# Patient Record
Sex: Female | Born: 2000 | Race: Black or African American | Hispanic: No | Smoking: Former smoker
Health system: Southern US, Community
[De-identification: ages and names within clinical notes are randomized; demographics above are authoritative.]

## PROBLEM LIST (undated history)

## (undated) DIAGNOSIS — E282 Polycystic ovarian syndrome: Secondary | ICD-10-CM

## (undated) DIAGNOSIS — N83202 Unspecified ovarian cyst, left side: Secondary | ICD-10-CM

## (undated) DIAGNOSIS — N96 Recurrent pregnancy loss: Secondary | ICD-10-CM

## (undated) DIAGNOSIS — F32A Depression, unspecified: Secondary | ICD-10-CM

## (undated) DIAGNOSIS — N83201 Unspecified ovarian cyst, right side: Secondary | ICD-10-CM

## (undated) DIAGNOSIS — N390 Urinary tract infection, site not specified: Secondary | ICD-10-CM

## (undated) HISTORY — PX: NO PAST SURGERIES: SHX2092

## (undated) HISTORY — DX: Recurrent pregnancy loss: N96

---
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
## 2021-03-03 ENCOUNTER — Encounter (HOSPITAL_BASED_OUTPATIENT_CLINIC_OR_DEPARTMENT_OTHER): Payer: Self-pay

## 2021-03-03 ENCOUNTER — Other Ambulatory Visit: Payer: Self-pay

## 2021-03-03 ENCOUNTER — Emergency Department (HOSPITAL_BASED_OUTPATIENT_CLINIC_OR_DEPARTMENT_OTHER)
Admission: EM | Admit: 2021-03-03 | Discharge: 2021-03-04 | Disposition: A | Discharge status: Home / Self Care | Payer: Medicaid Other | Attending: Emergency Medicine | Admitting: Emergency Medicine

## 2021-03-03 DIAGNOSIS — Z3A00 Weeks of gestation of pregnancy not specified: Secondary | ICD-10-CM | POA: Insufficient documentation

## 2021-03-03 DIAGNOSIS — O219 Vomiting of pregnancy, unspecified: Secondary | ICD-10-CM | POA: Insufficient documentation

## 2021-03-03 DIAGNOSIS — N3000 Acute cystitis without hematuria: Secondary | ICD-10-CM

## 2021-03-03 DIAGNOSIS — B9620 Unspecified Escherichia coli [E. coli] as the cause of diseases classified elsewhere: Secondary | ICD-10-CM | POA: Diagnosis not present

## 2021-03-03 DIAGNOSIS — O2310 Infections of bladder in pregnancy, unspecified trimester: Secondary | ICD-10-CM | POA: Insufficient documentation

## 2021-03-03 HISTORY — DX: Unspecified ovarian cyst, right side: N83.201

## 2021-03-03 HISTORY — DX: Unspecified ovarian cyst, right side: N83.202

## 2021-03-03 NOTE — ED Triage Notes (Addendum)
Pt is present for nausea x three days. No episodes of emesis. Pt c/o some lower abd discomfort. Denies UA sx and afebrile. Unsure if she may be pregnant and her period is late to start. Hx of ovarian cysts.

## 2021-03-04 LAB — URINALYSIS, ROUTINE W REFLEX MICROSCOPIC
Bilirubin Urine: NEGATIVE
Glucose, UA: NEGATIVE mg/dL
Hgb urine dipstick: NEGATIVE
Ketones, ur: 40 mg/dL — AB
Nitrite: POSITIVE — AB
Protein, ur: 30 mg/dL — AB
Specific Gravity, Urine: 1.038 — ABNORMAL HIGH (ref 1.005–1.030)
pH: 6 (ref 5.0–8.0)

## 2021-03-04 LAB — PREGNANCY, URINE: Preg Test, Ur: POSITIVE — AB

## 2021-03-04 MED ORDER — CEPHALEXIN 500 MG PO CAPS: 500.0000 mg | ORAL_CAPSULE | Freq: Three times a day (TID) | ORAL | 0 refills | Status: DC

## 2021-03-04 MED ORDER — PRENATAL COMPLETE 14-0.4 MG PO TABS: 1.0000 | ORAL_TABLET | Freq: Every day | ORAL | 3 refills | Status: DC

## 2021-03-04 MED ORDER — ONDANSETRON 4 MG PO TBDP
4.0000 mg | ORAL_TABLET | Freq: Once | ORAL | Status: AC
  Administered 2021-03-04: 4 mg via ORAL
  Filled 2021-03-04: qty 1

## 2021-03-04 NOTE — ED Provider Notes (Signed)
[place] EMERGENCY DEPT Provider Note   CSN: [id number] Arrival date & time: 03/03/21  2332     History Chief Complaint  Patient presents with   Nausea    Elizabeth Wheeler is a 20 y.o. female.  HPI     This is a 20 year old female with a history of ovarian cyst who presents with nausea x3 days.  No vomiting.  She states she has a history of ovarian cysts but "they do not seem to be bothering me as much is normal."  She denies any abdominal pain.  She is late on her menstrual period.  She states that she was due to have a period in early October.  She is unsure when her last menstrual period was but it was sometime in September.  She has been previously pregnant once with 1 miscarriage.  No vaginal bleeding.  She has not taken a home pregnancy test.  She has not taken anything for her symptoms.  Does report constipation.  Past Medical History:  Diagnosis Date   Cysts of both ovaries     There are no problems to display for this patient.   History reviewed. No pertinent surgical history.   OB History   No obstetric history on file.     No family history on file.  Social History   Tobacco Use   Smoking status: Never   Smokeless tobacco: Never  Substance Use Topics   Alcohol use: Yes    Comment: occ    Home Medications Prior to Admission medications   Medication Sig Start Date End Date Taking? Authorizing Provider  cephALEXin (KEFLEX) 500 MG capsule Take 1 capsule (500 mg total) by mouth 3 (three) times daily. 03/04/21  Yes Porshia Blizzard, Barbette Hair, MD  Prenatal Vit-Fe Fumarate-FA (PRENATAL COMPLETE) 14-0.4 MG TABS Take 1 tablet by mouth daily. 03/04/21  Yes Emmerson Shuffield, Barbette Hair, MD  meloxicam (MOBIC) 7.5 MG tablet Take 1 tablet (7.5 mg total) by mouth daily. 01/17/17   Cuthriell, Charline Bills, PA-C    Allergies    Amoxicillin  Review of Systems   Review of Systems  Constitutional:  Negative for fever.  Gastrointestinal:  Positive for constipation and  nausea. Negative for abdominal pain and vomiting.  Genitourinary:  Negative for dysuria and vaginal bleeding.  All other systems reviewed and are negative.  Physical Exam Updated Vital Signs BP 122/82 (BP Location: Right Arm)   Pulse 65   Temp 98.2 F (36.8 C) (Oral)   Resp 17   Ht 1.626 m (5\' 4" )   Wt 56.7 kg   LMP 01/17/2021 (Exact Date)   SpO2 100%   BMI 21.46 kg/m   Physical Exam Vitals and nursing note reviewed.  Constitutional:      Appearance: She is well-developed.  HENT:     Head: Normocephalic and atraumatic.     Nose: Nose normal.     Mouth/Throat:     Mouth: Mucous membranes are moist.  Eyes:     Pupils: Pupils are equal, round, and reactive to light.  Cardiovascular:     Rate and Rhythm: Normal rate and regular rhythm.     Heart sounds: Normal heart sounds.  Pulmonary:     Effort: Pulmonary effort is normal. No respiratory distress.  Abdominal:     General: Bowel sounds are normal.     Palpations: Abdomen is soft.     Tenderness: There is no abdominal tenderness. There is no guarding or rebound.  Musculoskeletal:     Cervical back:  Neck supple.  Skin:    General: Skin is warm and dry.  Neurological:     Mental Status: She is alert and oriented to person, place, and time.  Psychiatric:        Mood and Affect: Mood normal.    ED Results / Procedures / Treatments   Labs (all labs ordered are listed, but only abnormal results are displayed) Labs Reviewed  URINALYSIS, ROUTINE W REFLEX MICROSCOPIC - Abnormal; Notable for the following components:      Result Value   APPearance HAZY (*)    Specific Gravity, Urine 1.038 (*)    Ketones, ur 40 (*)    Protein, ur 30 (*)    Nitrite POSITIVE (*)    Leukocytes,Ua MODERATE (*)    Bacteria, UA MANY (*)    All other components within normal limits  PREGNANCY, URINE - Abnormal; Notable for the following components:   Preg Test, Ur POSITIVE (*)    All other components within normal limits  URINE CULTURE     EKG None  Radiology No results found.  Procedures Procedures   Medications Ordered in ED Medications  ondansetron (ZOFRAN-ODT) disintegrating tablet 4 mg (4 mg Oral Given 03/04/21 0014)    ED Course  I have reviewed the triage vital signs and the nursing notes.  Pertinent labs & imaging results that were available during my care of the patient were reviewed by me and considered in my medical decision making (see chart for details).    MDM Rules/Calculators/A&P                           Patient presents with nausea.  She is nontoxic-appearing and vital signs are reassuring.  She has a scant period.  She has no abdominal discomfort and her abdominal exam is benign.  Given her symptoms, would suspect pregnancy.  Other considerations include but not limited to, ovarian pathology, UTI.  Urinalysis sent.  Urinalysis is nitrite positive with some white cells.  Urine culture was sent.  Pregnancy test is also positive.  We will treat urine even the patient is without any urinary symptoms.  Recommend Unisom and B6 for her nausea.  Recommend starting a prenatal vitamin and follow-up at [facility].  Given absence of abdominal pain or vaginal bleeding, doubt ectopic or miscarriage at this time; however, patient was advised of return precautions.  After history, exam, and medical workup I feel the patient has been appropriately medically screened and is safe for discharge home. Pertinent diagnoses were discussed with the patient. Patient was given return precautions.  Final Clinical Impression(s) / ED Diagnoses Final diagnoses:  Nausea and vomiting during pregnancy  Acute cystitis without hematuria    Rx / DC Orders ED Discharge Orders          Ordered    Prenatal Vit-Fe Fumarate-FA (PRENATAL COMPLETE) 14-0.4 MG TABS  Daily        03/04/21 0042    cephALEXin (KEFLEX) 500 MG capsule  3 times daily        03/04/21 0045             Dray Dente, Mayer Masker, MD 03/04/21 [phone]

## 2021-03-04 NOTE — Discharge Instructions (Addendum)
You were seen today for nausea and vomiting.  You had a positive pregnancy test.  You may take vitamin B6 25 mg 3 times a day for nausea.  If that does not provide relief.  Take Unisom 1 tablet at night prior to bedtime.  Follow-up with OB/GYN.  Start a prenatal vitamin.  If you develop abdominal pain or vaginal bleeding, you should be reevaluated immediately.  It does also appear that you may have a urinary tract infection.  Take antibiotics as prescribed.

## 2021-03-05 ENCOUNTER — Emergency Department (HOSPITAL_BASED_OUTPATIENT_CLINIC_OR_DEPARTMENT_OTHER)
Admission: EM | Admit: 2021-03-05 | Discharge: 2021-03-05 | Disposition: A | Discharge status: Home / Self Care | Payer: Medicaid Other | Attending: Student | Admitting: Student

## 2021-03-05 ENCOUNTER — Other Ambulatory Visit: Payer: Self-pay

## 2021-03-05 ENCOUNTER — Encounter (HOSPITAL_BASED_OUTPATIENT_CLINIC_OR_DEPARTMENT_OTHER): Payer: Self-pay | Admitting: *Deleted

## 2021-03-05 DIAGNOSIS — U071 COVID-19: Secondary | ICD-10-CM | POA: Diagnosis not present

## 2021-03-05 DIAGNOSIS — O219 Vomiting of pregnancy, unspecified: Secondary | ICD-10-CM

## 2021-03-05 DIAGNOSIS — O99341 Other mental disorders complicating pregnancy, first trimester: Secondary | ICD-10-CM | POA: Insufficient documentation

## 2021-03-05 DIAGNOSIS — O98511 Other viral diseases complicating pregnancy, first trimester: Secondary | ICD-10-CM | POA: Diagnosis not present

## 2021-03-05 DIAGNOSIS — F419 Anxiety disorder, unspecified: Secondary | ICD-10-CM

## 2021-03-05 LAB — URINALYSIS, ROUTINE W REFLEX MICROSCOPIC
Bilirubin Urine: NEGATIVE
Glucose, UA: NEGATIVE mg/dL
Hgb urine dipstick: NEGATIVE
Ketones, ur: 40 mg/dL — AB
Leukocytes,Ua: NEGATIVE
Nitrite: NEGATIVE
Specific Gravity, Urine: 1.018 (ref 1.005–1.030)
pH: 7 (ref 5.0–8.0)

## 2021-03-05 LAB — COMPREHENSIVE METABOLIC PANEL
ALT: 5 U/L (ref 0–44)
AST: 13 U/L — ABNORMAL LOW (ref 15–41)
Albumin: 4.7 g/dL (ref 3.5–5.0)
Alkaline Phosphatase: 40 U/L (ref 38–126)
Anion gap: 12 (ref 5–15)
BUN: <5 mg/dL — ABNORMAL LOW (ref 6–20)
CO2: 20 mmol/L — ABNORMAL LOW (ref 22–32)
Calcium: 9.7 mg/dL (ref 8.9–10.3)
Chloride: 103 mmol/L (ref 98–111)
Creatinine, Ser: 0.52 mg/dL (ref 0.44–1.00)
GFR, Estimated: >60 mL/min (ref >60)
Glucose, Bld: 85 mg/dL (ref 70–99)
Potassium: 3.5 mmol/L (ref 3.5–5.1)
Sodium: 135 mmol/L (ref 135–145)
Total Bilirubin: 1 mg/dL (ref 0.3–1.2)
Total Protein: 7.8 g/dL (ref 6.5–8.1)

## 2021-03-05 LAB — CBC WITH DIFFERENTIAL/PLATELET
Abs Immature Granulocytes: 0.02 10*3/uL (ref 0.00–0.07)
Basophils Absolute: 0 10*3/uL (ref 0.0–0.1)
Basophils Relative: 0 %
Eosinophils Absolute: 0 10*3/uL (ref 0.0–0.5)
Eosinophils Relative: 0 %
HCT: 35.4 % — ABNORMAL LOW (ref 36.0–46.0)
Hemoglobin: 12.3 g/dL (ref 12.0–15.0)
Immature Granulocytes: 0 %
Lymphocytes Relative: 15 %
Lymphs Abs: 1.4 10*3/uL (ref 0.7–4.0)
MCH: 30.1 pg (ref 26.0–34.0)
MCHC: 34.7 g/dL (ref 30.0–36.0)
MCV: 86.8 fL (ref 80.0–100.0)
Monocytes Absolute: 0.7 10*3/uL (ref 0.1–1.0)
Monocytes Relative: 8 %
Neutro Abs: 6.9 10*3/uL (ref 1.7–7.7)
Neutrophils Relative %: 77 %
Platelets: 354 10*3/uL (ref 150–400)
RBC: 4.08 MIL/uL (ref 3.87–5.11)
RDW: 13 % (ref 11.5–15.5)
WBC: 9.1 10*3/uL (ref 4.0–10.5)
nRBC: 0 % (ref 0.0–0.2)

## 2021-03-05 LAB — RESP PANEL BY RT-PCR (FLU A&B, COVID) ARPGX2
Influenza A by PCR: NEGATIVE
Influenza B by PCR: NEGATIVE
SARS Coronavirus 2 by RT PCR: POSITIVE — AB

## 2021-03-05 LAB — LIPASE, BLOOD: Lipase: 18 U/L (ref 11–51)

## 2021-03-05 LAB — PREGNANCY, URINE: Preg Test, Ur: POSITIVE — AB

## 2021-03-05 MED ORDER — ONDANSETRON 4 MG PO TBDP
4.0000 mg | ORAL_TABLET | Freq: Once | ORAL | Status: AC
  Administered 2021-03-05: 4 mg via ORAL
  Filled 2021-03-05: qty 1

## 2021-03-05 MED ORDER — ONDANSETRON 4 MG PO TBDP: 4.0000 mg | ORAL_TABLET | Freq: Three times a day (TID) | ORAL | 0 refills | Status: DC | PRN

## 2021-03-05 NOTE — ED Notes (Signed)
D/c paperwork reviewed with pt, including prescription and f/u care. Pt verbalized understanding, no questions at time of d/c. Pt ambulatory to ED exit on RA, no assistance needed.

## 2021-03-05 NOTE — ED Provider Notes (Signed)
[place] EMERGENCY DEPT Provider Note   CSN: [id number] Arrival date & time: 03/05/21  1218     History Chief Complaint  Patient presents with   Emesis   Nausea    Elizabeth Wheeler is a 20 y.o. female currently pregnant in the first trimester who presents emergency department for evaluation of nausea and vomiting as well as anxiety and abdominal pain.  Patient seen in the emergency department at [place] 2 days ago and found to have a urinary tract infection.  Patient currently on Keflex but is having hard time keeping the medicine down due to persistent nausea.  She also endorses anxiety and feels that no one is there for her in the setting of her pregnancy.  She denies vaginal bleeding, chest pain, shortness of breath, headache, fever or other systemic symptoms.  Denies suicidal ideation, homicidal ideation, auditory or visual hallucinations.   Emesis Associated symptoms: abdominal pain   Associated symptoms: no arthralgias, no chills, no cough, no fever and no sore throat       Past Medical History:  Diagnosis Date   Cysts of both ovaries     There are no problems to display for this patient.   History reviewed. No pertinent surgical history.   OB History     Gravida  1   Para      Term      Preterm      AB      Living         SAB      IAB      Ectopic      Multiple      Live Births              No family history on file.  Social History   Tobacco Use   Smoking status: Never   Smokeless tobacco: Never  Vaping Use   Vaping Use: Never used  Substance Use Topics   Alcohol use: Yes    Comment: occ    Home Medications Prior to Admission medications   Medication Sig Start Date End Date Taking? Authorizing Provider  cephALEXin (KEFLEX) 500 MG capsule Take 1 capsule (500 mg total) by mouth 3 (three) times daily. 03/04/21  Yes Horton, Barbette Hair, MD  ondansetron (ZOFRAN ODT) 4 MG disintegrating tablet Take 1 tablet (4 mg  total) by mouth every 8 (eight) hours as needed for nausea or vomiting. 03/05/21  Yes Shalisa Mcquade, MD  Prenatal Vit-Fe Fumarate-FA (PRENATAL COMPLETE) 14-0.4 MG TABS Take 1 tablet by mouth daily. 03/04/21  Yes Horton, Barbette Hair, MD  meloxicam (MOBIC) 7.5 MG tablet Take 1 tablet (7.5 mg total) by mouth daily. Patient not taking: Reported on 03/05/2021 01/17/17   Cuthriell, Charline Bills, PA-C    Allergies    Amoxicillin  Review of Systems   Review of Systems  Constitutional:  Negative for chills and fever.  HENT:  Negative for ear pain and sore throat.   Eyes:  Negative for pain and visual disturbance.  Respiratory:  Negative for cough and shortness of breath.   Cardiovascular:  Negative for chest pain and palpitations.  Gastrointestinal:  Positive for abdominal pain, nausea and vomiting.  Genitourinary:  Negative for dysuria and hematuria.  Musculoskeletal:  Negative for arthralgias and back pain.  Skin:  Negative for color change and rash.  Neurological:  Negative for seizures and syncope.  All other systems reviewed and are negative.  Physical Exam Updated Vital Signs BP 128/72   Pulse 75  Temp 98.3 F (36.8 C)   Resp 15   Ht 5\' 4"  (1.626 m)   Wt 56.7 kg   LMP 01/12/2021   SpO2 100%   BMI 21.46 kg/m   Physical Exam Vitals and nursing note reviewed.  Constitutional:      General: She is not in acute distress.    Appearance: She is well-developed.  HENT:     Head: Normocephalic and atraumatic.  Eyes:     Conjunctiva/sclera: Conjunctivae normal.  Cardiovascular:     Rate and Rhythm: Normal rate and regular rhythm.     Heart sounds: No murmur heard. Pulmonary:     Effort: Pulmonary effort is normal. No respiratory distress.     Breath sounds: Normal breath sounds.  Abdominal:     Palpations: Abdomen is soft.     Tenderness: There is no abdominal tenderness.  Musculoskeletal:     Cervical back: Neck supple.  Skin:    General: Skin is warm and dry.   Neurological:     Mental Status: She is alert.    ED Results / Procedures / Treatments   Labs (all labs ordered are listed, but only abnormal results are displayed) Labs Reviewed  RESP PANEL BY RT-PCR (FLU A&B, COVID) ARPGX2 - Abnormal; Notable for the following components:      Result Value   SARS Coronavirus 2 by RT PCR POSITIVE (*)    All other components within normal limits  COMPREHENSIVE METABOLIC PANEL - Abnormal; Notable for the following components:   CO2 20 (*)    BUN <5 (*)    AST 13 (*)    All other components within normal limits  CBC WITH DIFFERENTIAL/PLATELET - Abnormal; Notable for the following components:   HCT 35.4 (*)    All other components within normal limits  URINALYSIS, ROUTINE W REFLEX MICROSCOPIC - Abnormal; Notable for the following components:   Ketones, ur 40 (*)    Protein, ur TRACE (*)    All other components within normal limits  PREGNANCY, URINE - Abnormal; Notable for the following components:   Preg Test, Ur POSITIVE (*)    All other components within normal limits  LIPASE, BLOOD    EKG None  Radiology No results found.  Procedures Procedures   Medications Ordered in ED Medications  ondansetron (ZOFRAN-ODT) disintegrating tablet 4 mg (4 mg Oral Given 03/05/21 1522)    ED Course  I have reviewed the triage vital signs and the nursing notes.  Pertinent labs & imaging results that were available during my care of the patient were reviewed by me and considered in my medical decision making (see chart for details).    MDM Rules/Calculators/A&P                           Patient seen the emergency department for evaluation of nausea and vomiting pregnancy.  Physical exam is unremarkable.  Bedside ultrasound performed that shows an enlarged uterus but no yolk sac.  No free fluid in the pelvis.  Patient states her last menstrual period was approximately 4 weeks ago and in the setting of no vaginal bleeding I have very low concern for  ectopic at this time.  Laboratory evaluation unremarkable and urinalysis does not show continued signs of infection.  Patient is COVID-positive and was given 4 mg p.o. Zofran which led to complete resolution of her symptoms.  She was given resources for the behavioral health urgent care to discuss her anxiety  in the setting of her pregnancy and a prescription was sent for the ODT Zofran.  Patient presentation likely due to a combination of her COVID-19 infection, vomiting in early pregnancy, and her known urinary tract infection.  She was encouraged to continue to complete her course of Keflex and she was discharged. Final Clinical Impression(s) / ED Diagnoses Final diagnoses:  Nausea and vomiting in pregnancy  Anxiety    Rx / DC Orders ED Discharge Orders          Ordered    ondansetron (ZOFRAN ODT) 4 MG disintegrating tablet  Every 8 hours PRN        03/05/21 1615             Persais Ethridge, Debe Coder, MD 03/05/21 1752

## 2021-03-05 NOTE — ED Triage Notes (Signed)
Pt states she has N/V for about 3 days, now just dry heaves, states she has abd pain at night, Also states she is stressed and not eating.

## 2021-03-05 NOTE — ED Notes (Signed)
Pt ambulatory to bathroom, no assistance needed.  

## 2021-03-06 LAB — URINE CULTURE: Culture: >=100000 — AB

## 2021-03-07 ENCOUNTER — Emergency Department (HOSPITAL_BASED_OUTPATIENT_CLINIC_OR_DEPARTMENT_OTHER)
Admission: EM | Admit: 2021-03-07 | Discharge: 2021-03-07 | Disposition: A | Discharge status: Home / Self Care | Payer: Medicaid Other | Attending: Emergency Medicine | Admitting: Emergency Medicine

## 2021-03-07 ENCOUNTER — Encounter (HOSPITAL_BASED_OUTPATIENT_CLINIC_OR_DEPARTMENT_OTHER): Payer: Self-pay | Admitting: *Deleted

## 2021-03-07 ENCOUNTER — Other Ambulatory Visit: Payer: Self-pay

## 2021-03-07 DIAGNOSIS — O98519 Other viral diseases complicating pregnancy, unspecified trimester: Secondary | ICD-10-CM | POA: Insufficient documentation

## 2021-03-07 DIAGNOSIS — Z3490 Encounter for supervision of normal pregnancy, unspecified, unspecified trimester: Secondary | ICD-10-CM

## 2021-03-07 DIAGNOSIS — O09899 Supervision of other high risk pregnancies, unspecified trimester: Secondary | ICD-10-CM | POA: Diagnosis not present

## 2021-03-07 DIAGNOSIS — R109 Unspecified abdominal pain: Secondary | ICD-10-CM | POA: Diagnosis not present

## 2021-03-07 DIAGNOSIS — R112 Nausea with vomiting, unspecified: Secondary | ICD-10-CM

## 2021-03-07 DIAGNOSIS — Z28310 Unvaccinated for covid-19: Secondary | ICD-10-CM | POA: Diagnosis not present

## 2021-03-07 DIAGNOSIS — U071 COVID-19: Secondary | ICD-10-CM | POA: Insufficient documentation

## 2021-03-07 DIAGNOSIS — J029 Acute pharyngitis, unspecified: Secondary | ICD-10-CM

## 2021-03-07 DIAGNOSIS — Z3A00 Weeks of gestation of pregnancy not specified: Secondary | ICD-10-CM | POA: Insufficient documentation

## 2021-03-07 LAB — CBC WITH DIFFERENTIAL/PLATELET
Abs Immature Granulocytes: 0.03 10*3/uL (ref 0.00–0.07)
Basophils Absolute: 0 10*3/uL (ref 0.0–0.1)
Basophils Relative: 0 %
Eosinophils Absolute: 0.1 10*3/uL (ref 0.0–0.5)
Eosinophils Relative: 1 %
HCT: 34.6 % — ABNORMAL LOW (ref 36.0–46.0)
Hemoglobin: 12.2 g/dL (ref 12.0–15.0)
Immature Granulocytes: 0 %
Lymphocytes Relative: 7 %
Lymphs Abs: 0.9 10*3/uL (ref 0.7–4.0)
MCH: 30.3 pg (ref 26.0–34.0)
MCHC: 35.3 g/dL (ref 30.0–36.0)
MCV: 85.9 fL (ref 80.0–100.0)
Monocytes Absolute: 1.1 10*3/uL — ABNORMAL HIGH (ref 0.1–1.0)
Monocytes Relative: 10 %
Neutro Abs: 9.8 10*3/uL — ABNORMAL HIGH (ref 1.7–7.7)
Neutrophils Relative %: 82 %
Platelets: 348 10*3/uL (ref 150–400)
RBC: 4.03 MIL/uL (ref 3.87–5.11)
RDW: 13 % (ref 11.5–15.5)
WBC: 11.9 10*3/uL — ABNORMAL HIGH (ref 4.0–10.5)
nRBC: 0 % (ref 0.0–0.2)

## 2021-03-07 LAB — URINALYSIS, ROUTINE W REFLEX MICROSCOPIC
Bilirubin Urine: NEGATIVE
Glucose, UA: NEGATIVE mg/dL
Hgb urine dipstick: NEGATIVE
Ketones, ur: 40 mg/dL — AB
Leukocytes,Ua: NEGATIVE
Nitrite: NEGATIVE
Specific Gravity, Urine: 1.026 (ref 1.005–1.030)
pH: 6 (ref 5.0–8.0)

## 2021-03-07 LAB — BASIC METABOLIC PANEL
Anion gap: 11 (ref 5–15)
BUN: 6 mg/dL (ref 6–20)
CO2: 21 mmol/L — ABNORMAL LOW (ref 22–32)
Calcium: 9.7 mg/dL (ref 8.9–10.3)
Chloride: 101 mmol/L (ref 98–111)
Creatinine, Ser: 0.54 mg/dL (ref 0.44–1.00)
GFR, Estimated: >60 mL/min (ref >60)
Glucose, Bld: 85 mg/dL (ref 70–99)
Potassium: 4.1 mmol/L (ref 3.5–5.1)
Sodium: 133 mmol/L — ABNORMAL LOW (ref 135–145)

## 2021-03-07 LAB — GROUP A STREP BY PCR: Group A Strep by PCR: NOT DETECTED

## 2021-03-07 MED ORDER — DOXYLAMINE-PYRIDOXINE 10-10 MG PO TBEC: 2.0000 | DELAYED_RELEASE_TABLET | Freq: Every day | ORAL | 0 refills | Status: DC

## 2021-03-07 MED ORDER — SODIUM CHLORIDE 0.9 % IV BOLUS
1000.0000 mL | Freq: Once | INTRAVENOUS | Status: AC
  Administered 2021-03-07: 1000 mL via INTRAVENOUS

## 2021-03-07 NOTE — Discharge Instructions (Addendum)
You are seen in the emergency department for continued symptoms from COVID along with nausea and vomiting.  You had lab work done and were given IV fluids.  Your strep test was negative. we are prescribing you some nausea medication that is safe in pregnancy.  Please contact your OB for follow-up.  You may also take Tylenol in pregnancy.

## 2021-03-07 NOTE — ED Provider Notes (Signed)
[facility] EMERGENCY DEPT Provider Note   CSN: [id number] Arrival date & time: 03/07/21  2015     History Chief Complaint  Patient presents with   Sore Throat    Elizabeth Wheeler is a 20 y.o. female.  She was here 3 days ago and was diagnosed with COVID.  She is also on antibiotics for UTI.  She continues to not feel well and has a worse sore throat.  She does not feel like she can take her meds because she is nauseous.  No vaginal bleeding chest pain shortness of breath fevers.  She is not COVID vaccinated.  She has not seen her OB yet.  Her last period was in early September.  The history is provided by the patient.  Sore Throat This is a new problem. The current episode started yesterday. The problem occurs constantly. The problem has not changed since onset.Associated symptoms include abdominal pain (Intermittent cramps). Pertinent negatives include no chest pain, no headaches and no shortness of breath. Nothing aggravates the symptoms. Nothing relieves the symptoms. She has tried rest for the symptoms. The treatment provided no relief.      Past Medical History:  Diagnosis Date   Cysts of both ovaries     There are no problems to display for this patient.   History reviewed. No pertinent surgical history.   OB History     Gravida  1   Para      Term      Preterm      AB      Living         SAB      IAB      Ectopic      Multiple      Live Births              No family history on file.  Social History   Tobacco Use   Smoking status: Never   Smokeless tobacco: Never  Vaping Use   Vaping Use: Never used  Substance Use Topics   Alcohol use: Yes    Comment: occ    Home Medications Prior to Admission medications   Medication Sig Start Date End Date Taking? Authorizing Provider  cephALEXin (KEFLEX) 500 MG capsule Take 1 capsule (500 mg total) by mouth 3 (three) times daily. 03/04/21   Horton, Mayer Masker, MD  meloxicam  (MOBIC) 7.5 MG tablet Take 1 tablet (7.5 mg total) by mouth daily. Patient not taking: Reported on 03/05/2021 01/17/17   Cuthriell, Delorise Royals, PA-C  ondansetron (ZOFRAN ODT) 4 MG disintegrating tablet Take 1 tablet (4 mg total) by mouth every 8 (eight) hours as needed for nausea or vomiting. 03/05/21   Kommor, Madison, MD  Prenatal Vit-Fe Fumarate-FA (PRENATAL COMPLETE) 14-0.4 MG TABS Take 1 tablet by mouth daily. 03/04/21   Horton, Mayer Masker, MD    Allergies    Amoxicillin  Review of Systems   Review of Systems  Constitutional:  Negative for fever.  HENT:  Positive for sore throat.   Eyes:  Negative for visual disturbance.  Respiratory:  Negative for shortness of breath.   Cardiovascular:  Negative for chest pain.  Gastrointestinal:  Positive for abdominal pain (Intermittent cramps), nausea and vomiting.  Genitourinary:  Negative for dysuria.  Musculoskeletal:  Negative for neck pain.  Skin:  Negative for rash.  Neurological:  Negative for headaches.   Physical Exam Updated Vital Signs BP 124/86 (BP Location: Right Arm)   Pulse 72   Temp  98 F (36.7 C) (Tympanic)   Resp 16   LMP 01/17/2021 (Exact Date)   SpO2 100%   Physical Exam Vitals and nursing note reviewed.  Constitutional:      General: She is not in acute distress.    Appearance: She is well-developed.  HENT:     Head: Normocephalic and atraumatic.     Mouth/Throat:     Mouth: Mucous membranes are moist.     Pharynx: Oropharynx is clear. No oropharyngeal exudate or posterior oropharyngeal erythema.  Eyes:     Conjunctiva/sclera: Conjunctivae normal.  Cardiovascular:     Rate and Rhythm: Normal rate and regular rhythm.     Heart sounds: No murmur heard. Pulmonary:     Effort: Pulmonary effort is normal. No respiratory distress.     Breath sounds: Normal breath sounds.  Abdominal:     Palpations: Abdomen is soft.     Tenderness: There is no abdominal tenderness. There is no guarding or rebound.   Musculoskeletal:     Cervical back: Neck supple.  Skin:    General: Skin is warm and dry.     Capillary Refill: Capillary refill takes less than 2 seconds.  Neurological:     General: No focal deficit present.     Mental Status: She is alert.    ED Results / Procedures / Treatments   Labs (all labs ordered are listed, but only abnormal results are displayed) Labs Reviewed  BASIC METABOLIC PANEL - Abnormal; Notable for the following components:      Result Value   Sodium 133 (*)    CO2 21 (*)    All other components within normal limits  CBC WITH DIFFERENTIAL/PLATELET - Abnormal; Notable for the following components:   WBC 11.9 (*)    HCT 34.6 (*)    Neutro Abs 9.8 (*)    Monocytes Absolute 1.1 (*)    All other components within normal limits  URINALYSIS, ROUTINE W REFLEX MICROSCOPIC - Abnormal; Notable for the following components:   Ketones, ur 40 (*)    Protein, ur TRACE (*)    All other components within normal limits  GROUP A STREP BY PCR    EKG None  Radiology No results found.  Procedures Procedures   Medications Ordered in ED Medications  sodium chloride 0.9 % bolus 1,000 mL (has no administration in time range)    ED Course  I have reviewed the triage vital signs and the nursing notes.  Pertinent labs & imaging results that were available during my care of the patient were reviewed by me and considered in my medical decision making (see chart for details).    MDM Rules/Calculators/A&P                          Elizabeth Wheeler was evaluated in Emergency Department on 03/07/2021 for the symptoms described in the history of present illness. She was evaluated in the context of the global COVID-19 pandemic, which necessitated consideration that the patient might be at risk for infection with the SARS-CoV-2 virus that causes COVID-19. Institutional protocols and algorithms that pertain to the evaluation of patients at risk for COVID-19 are in a state of rapid  change based on information released by regulatory bodies including the CDC and federal and state organizations. These policies and algorithms were followed during the patient's care in the ED.  This patient complains of nausea and sore throat in early pregnancy COVID; this involves an extensive  number of treatment Options and is a complaint that carries with it a high risk of complications and Morbidity. The differential includes COVID, dehydration, hyperemesis, UTI  I ordered, reviewed and interpreted labs, which included CBC with mildly elevated white count stable hemoglobin, chemistries with some low bicarb reflecting dehydration, urinalysis without signs of infection although does have ketones, strep negative I ordered medication IV fluids Additional history obtained from patient and significant other Previous records obtained and reviewed in epic including prior ED visits  After the interventions stated above, I reevaluated the patient and found patient be nontoxic-appearing and hemodynamically stable.  She appears adequately hydrated now.  We will treat with diclegis for morning sickness.  Recommended follow-up with OB.  Return instructions discussed   Final Clinical Impression(s) / ED Diagnoses Final diagnoses:  Sore throat  Nausea and vomiting, unspecified vomiting type  Early stage of pregnancy  COVID-19 virus infection    Rx / DC Orders ED Discharge Orders          Ordered    Doxylamine-Pyridoxine 10-10 MG TBEC  Daily at bedtime        03/07/21 2210             Hayden Rasmussen, MD 03/08/21 [phone]

## 2021-03-07 NOTE — ED Triage Notes (Signed)
Pt was dx with UTI, being pregnant and covid when she was seen last and was sent home with instructions to come back if she is feeling worse.  Pt is here due to sore throat and not feeling better.

## 2021-03-07 NOTE — ED Notes (Signed)
This RN presented the AVS utilizing Teachback Method. Patient verbalizes understanding of Discharge Instructions. Opportunity for Questioning and Answers were provided. Patient Discharged from ED ambulatory to Home with Significant Other.   

## 2021-03-29 ENCOUNTER — Other Ambulatory Visit: Payer: Self-pay

## 2021-03-29 ENCOUNTER — Emergency Department (HOSPITAL_BASED_OUTPATIENT_CLINIC_OR_DEPARTMENT_OTHER)
Admission: EM | Admit: 2021-03-29 | Discharge: 2021-03-29 | Disposition: A | Discharge status: Home / Self Care | Payer: Medicaid Other | Attending: Emergency Medicine | Admitting: Emergency Medicine

## 2021-03-29 ENCOUNTER — Emergency Department (HOSPITAL_BASED_OUTPATIENT_CLINIC_OR_DEPARTMENT_OTHER): Payer: Medicaid Other

## 2021-03-29 ENCOUNTER — Encounter (HOSPITAL_BASED_OUTPATIENT_CLINIC_OR_DEPARTMENT_OTHER): Payer: Self-pay | Admitting: *Deleted

## 2021-03-29 DIAGNOSIS — Z87891 Personal history of nicotine dependence: Secondary | ICD-10-CM | POA: Insufficient documentation

## 2021-03-29 DIAGNOSIS — R1031 Right lower quadrant pain: Secondary | ICD-10-CM | POA: Diagnosis not present

## 2021-03-29 DIAGNOSIS — N9489 Other specified conditions associated with female genital organs and menstrual cycle: Secondary | ICD-10-CM | POA: Insufficient documentation

## 2021-03-29 DIAGNOSIS — J101 Influenza due to other identified influenza virus with other respiratory manifestations: Secondary | ICD-10-CM | POA: Insufficient documentation

## 2021-03-29 DIAGNOSIS — Z20822 Contact with and (suspected) exposure to covid-19: Secondary | ICD-10-CM | POA: Insufficient documentation

## 2021-03-29 DIAGNOSIS — O039 Complete or unspecified spontaneous abortion without complication: Secondary | ICD-10-CM | POA: Diagnosis not present

## 2021-03-29 DIAGNOSIS — N939 Abnormal uterine and vaginal bleeding, unspecified: Secondary | ICD-10-CM | POA: Diagnosis present

## 2021-03-29 DIAGNOSIS — Z79899 Other long term (current) drug therapy: Secondary | ICD-10-CM | POA: Insufficient documentation

## 2021-03-29 DIAGNOSIS — R52 Pain, unspecified: Secondary | ICD-10-CM

## 2021-03-29 LAB — URINALYSIS, ROUTINE W REFLEX MICROSCOPIC
Bilirubin Urine: NEGATIVE
Glucose, UA: NEGATIVE mg/dL
Ketones, ur: >80 mg/dL — AB
Leukocytes,Ua: NEGATIVE
Nitrite: NEGATIVE
Protein, ur: >300 mg/dL — AB
Specific Gravity, Urine: >1.046 — ABNORMAL HIGH (ref 1.005–1.030)
pH: 6 (ref 5.0–8.0)

## 2021-03-29 LAB — RAPID URINE DRUG SCREEN, HOSP PERFORMED
Amphetamines: NOT DETECTED
Barbiturates: NOT DETECTED
Benzodiazepines: NOT DETECTED
Cocaine: NOT DETECTED
Opiates: NOT DETECTED
Tetrahydrocannabinol: POSITIVE — AB

## 2021-03-29 LAB — COMPREHENSIVE METABOLIC PANEL
ALT: 31 U/L (ref 0–44)
AST: 36 U/L (ref 15–41)
Albumin: 4.7 g/dL (ref 3.5–5.0)
Alkaline Phosphatase: 48 U/L (ref 38–126)
Anion gap: 16 — ABNORMAL HIGH (ref 5–15)
BUN: 10 mg/dL (ref 6–20)
CO2: 18 mmol/L — ABNORMAL LOW (ref 22–32)
Calcium: 9.8 mg/dL (ref 8.9–10.3)
Chloride: 101 mmol/L (ref 98–111)
Creatinine, Ser: 0.59 mg/dL (ref 0.44–1.00)
GFR, Estimated: >60 mL/min (ref >60)
Glucose, Bld: 116 mg/dL — ABNORMAL HIGH (ref 70–99)
Potassium: 3.6 mmol/L (ref 3.5–5.1)
Sodium: 135 mmol/L (ref 135–145)
Total Bilirubin: 0.4 mg/dL (ref 0.3–1.2)
Total Protein: 7.8 g/dL (ref 6.5–8.1)

## 2021-03-29 LAB — CBC
HCT: 35.4 % — ABNORMAL LOW (ref 36.0–46.0)
Hemoglobin: 12.5 g/dL (ref 12.0–15.0)
MCH: 31.1 pg (ref 26.0–34.0)
MCHC: 35.3 g/dL (ref 30.0–36.0)
MCV: 88.1 fL (ref 80.0–100.0)
Platelets: 278 10*3/uL (ref 150–400)
RBC: 4.02 MIL/uL (ref 3.87–5.11)
RDW: 14.4 % (ref 11.5–15.5)
WBC: 10.5 10*3/uL (ref 4.0–10.5)
nRBC: 0 % (ref 0.0–0.2)

## 2021-03-29 LAB — LIPASE, BLOOD: Lipase: 16 U/L (ref 11–51)

## 2021-03-29 LAB — RESP PANEL BY RT-PCR (FLU A&B, COVID) ARPGX2
Influenza A by PCR: POSITIVE — AB
Influenza B by PCR: NEGATIVE
SARS Coronavirus 2 by RT PCR: NEGATIVE

## 2021-03-29 LAB — ABO/RH: ABO/RH(D): A POS

## 2021-03-29 LAB — HCG, QUANTITATIVE, PREGNANCY: hCG, Beta Chain, Quant, S: 17504 m[IU]/mL — ABNORMAL HIGH (ref <5)

## 2021-03-29 MED ORDER — LACTATED RINGERS IV BOLUS
1000.0000 mL | Freq: Once | INTRAVENOUS | Status: AC
  Administered 2021-03-29: 1000 mL via INTRAVENOUS

## 2021-03-29 MED ORDER — ONDANSETRON HCL 4 MG/2ML IJ SOLN
4.0000 mg | Freq: Once | INTRAMUSCULAR | Status: AC
  Administered 2021-03-29: 4 mg via INTRAVENOUS
  Filled 2021-03-29: qty 2

## 2021-03-29 MED ORDER — IBUPROFEN 800 MG PO TABS: 800.0000 mg | ORAL_TABLET | Freq: Three times a day (TID) | ORAL | 0 refills | Status: DC

## 2021-03-29 MED ORDER — ONDANSETRON 4 MG PO TBDP: 4.0000 mg | ORAL_TABLET | ORAL | 0 refills | Status: DC | PRN

## 2021-03-29 NOTE — ED Triage Notes (Signed)
Lower abd pain with vomiting today, feels like lower abd is being pushed on, vaginal bleeding for 2 days, approx 2 mths pregnant

## 2021-03-29 NOTE — ED Provider Notes (Signed)
[facility] EMERGENCY DEPT Provider Note   CSN: [id number] Arrival date & time: 03/29/21  1021     History Chief Complaint  Patient presents with   Vaginal Bleeding   Abdominal Pain    Elizabeth Wheeler is a 20 y.o. female.  HPI Patient reports that she is estimated weeks pregnant.  She reports that she was feeling unwell yesterday during the day.  She was achy and fatigued.  She reports she was having some sweats.  She reports at 5 in the morning today, she awakened suddenly with very severe lower abdominal pain.  She reports it feels like a really intense pressure and pushing sensation in her lower abdomen.  Migrating a little bit from side to side.  She reports that the pain is waxing and waning.  Patient reports that she has been having some vaginal bleeding and spotting for couple of days.  She reports she has been having some discomfort with urination.  This is a first pregnancy for her.    Past Medical History:  Diagnosis Date   Cysts of both ovaries     There are no problems to display for this patient.   History reviewed. No pertinent surgical history.   OB History     Gravida  1   Para      Term      Preterm      AB      Living         SAB      IAB      Ectopic      Multiple      Live Births              No family history on file.  Social History   Tobacco Use   Smoking status: Former    Types: Cigarettes    Quit date: 02/01/2021    Years since quitting: 0.1   Smokeless tobacco: Never  Vaping Use   Vaping Use: Never used  Substance Use Topics   Alcohol use: Yes    Comment: occ   Drug use: Never    Home Medications Prior to Admission medications   Medication Sig Start Date End Date Taking? Authorizing Provider  ibuprofen (ADVIL) 800 MG tablet Take 1 tablet (800 mg total) by mouth 3 (three) times daily. 03/29/21  Yes Arby Barrette, MD  ondansetron (ZOFRAN-ODT) 4 MG disintegrating tablet Take 1 tablet (4 mg  total) by mouth every 4 (four) hours as needed for nausea or vomiting. 03/29/21  Yes Valerian Jewel, Lebron Conners, MD  cephALEXin (KEFLEX) 500 MG capsule Take 1 capsule (500 mg total) by mouth 3 (three) times daily. 03/04/21   Horton, Mayer Masker, MD  Doxylamine-Pyridoxine 10-10 MG TBEC Take 2 tablets by mouth at bedtime. 03/07/21   Terrilee Files, MD  meloxicam (MOBIC) 7.5 MG tablet Take 1 tablet (7.5 mg total) by mouth daily. Patient not taking: Reported on 03/05/2021 01/17/17   Cuthriell, Delorise Royals, PA-C  ondansetron (ZOFRAN ODT) 4 MG disintegrating tablet Take 1 tablet (4 mg total) by mouth every 8 (eight) hours as needed for nausea or vomiting. 03/05/21   Kommor, Madison, MD  Prenatal Vit-Fe Fumarate-FA (PRENATAL COMPLETE) 14-0.4 MG TABS Take 1 tablet by mouth daily. 03/04/21   Horton, Mayer Masker, MD    Allergies    Amoxicillin  Review of Systems   Review of Systems 10 systems reviewed and negative except as per HPI Physical Exam Updated Vital Signs BP 103/69 (BP Location: Left Arm)  Pulse 92   Temp 98.6 F (37 C)   Resp 16   Ht 5\' 4"  (1.626 m)   Wt 59 kg   LMP 01/08/2021 (Exact Date)   SpO2 100%   BMI 22.31 kg/m   Physical Exam Constitutional:      Comments: Alert nontoxic.  No respiratory distress.  Intermittently appears to be in significant pain.  HENT:     Mouth/Throat:     Pharynx: Oropharynx is clear.  Eyes:     Extraocular Movements: Extraocular movements intact.  Cardiovascular:     Rate and Rhythm: Normal rate and regular rhythm.  Pulmonary:     Effort: Pulmonary effort is normal.     Breath sounds: Normal breath sounds.  Abdominal:     Comments: Abdomen soft.  Moderate right lower quadrant pain.  No guarding  Musculoskeletal:        General: No swelling or tenderness. Normal range of motion.     Right lower leg: No edema.     Left lower leg: No edema.  Skin:    General: Skin is warm and dry.  Neurological:     General: No focal deficit present.     Mental  Status: She is oriented to person, place, and time.     Coordination: Coordination normal.  Psychiatric:     Comments: Anxious.    ED Results / Procedures / Treatments   Labs (all labs ordered are listed, but only abnormal results are displayed) Labs Reviewed  RESP PANEL BY RT-PCR (FLU A&B, COVID) ARPGX2 - Abnormal; Notable for the following components:      Result Value   Influenza A by PCR POSITIVE (*)    All other components within normal limits  COMPREHENSIVE METABOLIC PANEL - Abnormal; Notable for the following components:   CO2 18 (*)    Glucose, Bld 116 (*)    Anion gap 16 (*)    All other components within normal limits  CBC - Abnormal; Notable for the following components:   HCT 35.4 (*)    All other components within normal limits  URINALYSIS, ROUTINE W REFLEX MICROSCOPIC - Abnormal; Notable for the following components:   Specific Gravity, Urine >1.046 (*)    Hgb urine dipstick LARGE (*)    Ketones, ur >80 (*)    Protein, ur >300 (*)    All other components within normal limits  RAPID URINE DRUG SCREEN, HOSP PERFORMED - Abnormal; Notable for the following components:   Tetrahydrocannabinol POSITIVE (*)    All other components within normal limits  HCG, QUANTITATIVE, PREGNANCY - Abnormal; Notable for the following components:   hCG, Beta Chain, Quant, S 17,504 (*)    All other components within normal limits  LIPASE, BLOOD  ABO/RH    EKG None  Radiology 03/10/2021 OB LESS THAN 14 WEEKS WITH OB TRANSVAGINAL  Result Date: 03/29/2021 CLINICAL DATA:  20 year old pregnant female presents with 2 days of vaginal bleeding and pelvic cramping. Quantitative beta HCG pending. EDC by LMP: 10/15/2021, projecting to an expected gestational age of [redacted] weeks 3 days. EXAM: OBSTETRIC <14 WK 10/17/2021 AND TRANSVAGINAL OB US TECHNIQUE: Both transabdominal and transvaginal ultrasound examinations were performed for complete evaluation of the gestation as well as the maternal uterus, adnexal  regions, and pelvic cul-de-sac. Transvaginal technique was performed to assess early pregnancy. COMPARISON:  None. FINDINGS: Anteverted uterus measures 10.3 x 4.7 x 5.6 cm. No uterine fibroids. Irregular gestational sac is located in the endocervical canal. Embryo identified within the yolk  sac measuring 10.3 mm by crown-rump length, correlating to a gestational age of [redacted] weeks 1 day. No yolk sac identified. Heterogeneous debris within the irregular yolk sac. No embryonic cardiac activity. Right ovary measures 4.2 x 1.9 x 2.5 cm and is normal. Left ovary is seen only on the transabdominal images, measuring 3.4 x 2.0 x 1.7 cm, normal. No abnormal ovarian or adnexal masses. No abnormal free fluid in the pelvis. IMPRESSION: Sonographic findings are compatible with spontaneous abortion in progress, with irregular gestational sac in the endocervical canal with no yolk sac and with embryo measuring 7 weeks 1 day by crown-rump length. No embryonic cardiac activity, compatible with non-viable gestation. Normal ovaries. No adnexal masses. No abnormal free fluid in the pelvis. Electronically Signed   By: Delbert Phenix M.D.   On: 03/29/2021 12:05    Procedures Procedures   Medications Ordered in ED Medications  lactated ringers bolus 1,000 mL (1,000 mLs Intravenous New Bag/Given 03/29/21 1213)  ondansetron (ZOFRAN) injection 4 mg (4 mg Intravenous Given 03/29/21 1213)    ED Course  I have reviewed the triage vital signs and the nursing notes.  Pertinent labs & imaging results that were available during my care of the patient were reviewed by me and considered in my medical decision making (see chart for details).    MDM Rules/Calculators/A&P                           Patient presents as outlined.  She is confirmed positive pregnancy with quant 20,002 days ago.  Patient reports occasional spotting over the past 2 days.  We will need to proceed with ultrasound to rule out ectopic.  Patient also describes some  general constitutional symptoms over the past 24 hours.  We will proceed with evaluation for other source of illness such as UTI, influenza, other viral illness, appendicitis.  Will initiate treatment with fluid resuscitation and nausea control.  Ultrasound consistent with spontaneous AB.  No ectopic pregnancy identified.  Patient is having cramping and some bleeding.  At this time clinically well in appearance.  Patient is counseled on home management of cramping with ibuprofen, rest and fluid hydration.  She is counseled on anticipated passage of clot and products of conception.  Patient is instructed on follow-up with OB and to the maternity unit at [facility] if worsening or changing symptoms.  Patient also test positive for influenza.  This is consistent with several days of malaise as described.  Patient is clinically well in appearance.  No respiratory distress.  Counseled on Zofran for nausea as needed and ibuprofen for control of body ache or fever. Final Clinical Impression(s) / ED Diagnoses Final diagnoses:  Spontaneous abortion in first trimester  Influenza A    Rx / DC Orders ED Discharge Orders          Ordered    ibuprofen (ADVIL) 800 MG tablet  3 times daily        03/29/21 1354    ondansetron (ZOFRAN-ODT) 4 MG disintegrating tablet  Every 4 hours PRN        03/29/21 1354             Arby Barrette, MD 03/29/21 1400

## 2021-03-29 NOTE — Discharge Instructions (Signed)
1.  At this time ultrasound suggest that your pregnancy is in the process of a miscarriage.  You may have another day or 2 bleeding and cramping.  If your symptoms are persisting or worsening, go to the [facility] maternity hospital at [facility].  Call your obstetrician as soon as possible to get a recheck. 2.  You have also tested positive for influenza.  This will increase your body aches and fatigue.  Take ibuprofen as prescribed every 8 hours for body aches and fever.  You may also take the ibuprofen as prescribed for cramping abdominal pain.  You have been prescribed nausea medicine called Zofran.  Take this if needed.  Return to the emergency department if your symptoms are worsening or new concerning symptoms develop.

## 2021-11-15 ENCOUNTER — Other Ambulatory Visit: Payer: Self-pay

## 2021-11-15 ENCOUNTER — Emergency Department (HOSPITAL_BASED_OUTPATIENT_CLINIC_OR_DEPARTMENT_OTHER)
Admission: EM | Admit: 2021-11-15 | Discharge: 2021-11-15 | Disposition: A | Discharge status: Home / Self Care | Payer: Medicaid Other | Attending: Emergency Medicine | Admitting: Emergency Medicine

## 2021-11-15 ENCOUNTER — Encounter (HOSPITAL_BASED_OUTPATIENT_CLINIC_OR_DEPARTMENT_OTHER): Payer: Self-pay | Admitting: Emergency Medicine

## 2021-11-15 DIAGNOSIS — O99280 Endocrine, nutritional and metabolic diseases complicating pregnancy, unspecified trimester: Secondary | ICD-10-CM | POA: Insufficient documentation

## 2021-11-15 DIAGNOSIS — E871 Hypo-osmolality and hyponatremia: Secondary | ICD-10-CM | POA: Insufficient documentation

## 2021-11-15 DIAGNOSIS — O219 Vomiting of pregnancy, unspecified: Secondary | ICD-10-CM | POA: Insufficient documentation

## 2021-11-15 DIAGNOSIS — Z3A00 Weeks of gestation of pregnancy not specified: Secondary | ICD-10-CM | POA: Diagnosis not present

## 2021-11-15 LAB — CBC WITH DIFFERENTIAL/PLATELET
Abs Immature Granulocytes: 0.03 10*3/uL (ref 0.00–0.07)
Basophils Absolute: 0 10*3/uL (ref 0.0–0.1)
Basophils Relative: 0 %
Eosinophils Absolute: 0 10*3/uL (ref 0.0–0.5)
Eosinophils Relative: 0 %
HCT: 36.8 % (ref 36.0–46.0)
Hemoglobin: 13.2 g/dL (ref 12.0–15.0)
Immature Granulocytes: 0 %
Lymphocytes Relative: 20 %
Lymphs Abs: 1.9 10*3/uL (ref 0.7–4.0)
MCH: 31.7 pg (ref 26.0–34.0)
MCHC: 35.9 g/dL (ref 30.0–36.0)
MCV: 88.5 fL (ref 80.0–100.0)
Monocytes Absolute: 0.8 10*3/uL (ref 0.1–1.0)
Monocytes Relative: 8 %
Neutro Abs: 7 10*3/uL (ref 1.7–7.7)
Neutrophils Relative %: 72 %
Platelets: 344 10*3/uL (ref 150–400)
RBC: 4.16 MIL/uL (ref 3.87–5.11)
RDW: 11.6 % (ref 11.5–15.5)
WBC: 9.9 10*3/uL (ref 4.0–10.5)
nRBC: 0 % (ref 0.0–0.2)

## 2021-11-15 LAB — COMPREHENSIVE METABOLIC PANEL
ALT: 5 U/L (ref 0–44)
AST: 16 U/L (ref 15–41)
Albumin: 5 g/dL (ref 3.5–5.0)
Alkaline Phosphatase: 38 U/L (ref 38–126)
Anion gap: 12 (ref 5–15)
BUN: 6 mg/dL (ref 6–20)
CO2: 21 mmol/L — ABNORMAL LOW (ref 22–32)
Calcium: 10.4 mg/dL — ABNORMAL HIGH (ref 8.9–10.3)
Chloride: 99 mmol/L (ref 98–111)
Creatinine, Ser: 0.55 mg/dL (ref 0.44–1.00)
GFR, Estimated: >60 mL/min (ref >60)
Glucose, Bld: 78 mg/dL (ref 70–99)
Potassium: 3.9 mmol/L (ref 3.5–5.1)
Sodium: 132 mmol/L — ABNORMAL LOW (ref 135–145)
Total Bilirubin: 1.7 mg/dL — ABNORMAL HIGH (ref 0.3–1.2)
Total Protein: 8.2 g/dL — ABNORMAL HIGH (ref 6.5–8.1)

## 2021-11-15 LAB — URINALYSIS, ROUTINE W REFLEX MICROSCOPIC
Bilirubin Urine: NEGATIVE
Glucose, UA: NEGATIVE mg/dL
Hgb urine dipstick: NEGATIVE
Ketones, ur: >80 mg/dL — AB
Nitrite: NEGATIVE
Protein, ur: 30 mg/dL — AB
Specific Gravity, Urine: 1.036 — ABNORMAL HIGH (ref 1.005–1.030)
pH: 6 (ref 5.0–8.0)

## 2021-11-15 LAB — PREGNANCY, URINE: Preg Test, Ur: POSITIVE — AB

## 2021-11-15 LAB — HCG, QUANTITATIVE, PREGNANCY: hCG, Beta Chain, Quant, S: 72795 m[IU]/mL — ABNORMAL HIGH (ref <5)

## 2021-11-15 MED ORDER — LACTATED RINGERS IV BOLUS
1000.0000 mL | Freq: Once | INTRAVENOUS | Status: AC
  Administered 2021-11-15: 1000 mL via INTRAVENOUS

## 2021-11-15 MED ORDER — PROMETHAZINE HCL 25 MG PO TABS: 25.0000 mg | ORAL_TABLET | Freq: Four times a day (QID) | ORAL | 0 refills | Status: DC | PRN

## 2021-11-15 MED ORDER — VITAMIN B-6 25 MG PO TABS: 25.0000 mg | ORAL_TABLET | Freq: Four times a day (QID) | ORAL | 1 refills | Status: DC | PRN

## 2021-11-15 MED ORDER — SODIUM CHLORIDE 0.9 % IV SOLN
12.5000 mg | INTRAVENOUS | Status: AC
  Administered 2021-11-15: 12.5 mg via INTRAVENOUS
  Filled 2021-11-15: qty 0.5

## 2021-11-15 MED ORDER — PROMETHAZINE HCL 25 MG/ML IJ SOLN
INTRAMUSCULAR | Status: AC
  Filled 2021-11-15: qty 1

## 2021-11-15 NOTE — Discharge Instructions (Addendum)
Please follow up with our OBGYN. Return to ER for any pain or vaginal bleeding.   Safe Medications in Pregnancy  Acne: Benzoyl Peroxide Salicylic Acid  Backache/Headache: Tylenol: 2 regular strength every 4 hours OR              2 Extra strength every 6 hours  Colds/Coughs/Allergies: Benadryl (alcohol free) 25 mg every 6 hours as needed Breath right strips Claritin Cepacol throat lozenges Chloraseptic throat spray Cold-Eeze- up to three times per day Cough drops, alcohol free Flonase (by prescription only) Guaifenesin Mucinex Robitussin DM (plain only, alcohol free) Saline nasal spray/drops Sudafed (pseudoephedrine) & Actifed ** use only after [redacted] weeks gestation and if you do not have high blood pressure Tylenol Vicks Vaporub Zinc lozenges Zyrtec   Constipation: Colace Ducolax suppositories Fleet enema Glycerin suppositories Metamucil Milk of magnesia Miralax Senokot Smooth move tea  Diarrhea: Kaopectate Imodium A-D  *NO pepto Bismol  Hemorrhoids: Anusol Anusol HC Preparation H Tucks  Indigestion: Tums Maalox Mylanta Zantac  Pepcid  Insomnia: Benadryl (alcohol free) 25mg  every 6 hours as needed Tylenol PM Unisom, no Gelcaps  Leg Cramps: Tums MagGel  Nausea/Vomiting:  Bonine Dramamine Emetrol Ginger extract Sea bands Meclizine  Nausea medication to take during pregnancy:  Unisom (doxylamine succinate 25 mg tablets) Take one tablet daily at bedtime. If symptoms are not adequately controlled, the dose can be increased to a maximum recommended dose of two tablets daily (1/2 tablet in the morning, 1/2 tablet mid-afternoon and one at bedtime). Vitamin B6 100mg  tablets. Take one tablet twice a day (up to 200 mg per day).  Skin Rashes: Aveeno products Benadryl cream or 25mg  every 6 hours as needed Calamine Lotion 1% cortisone cream  Yeast infection: Gyne-lotrimin 7 Monistat 7  Gum/tooth pain: Anbesol  **If taking multiple  medications, please check labels to avoid duplicating the same active ingredients **take medication as directed on the label ** Do not exceed 4000 mg of tylenol in 24 hours **Do not take medications that contain aspirin or ibuprofen

## 2021-11-15 NOTE — ED Triage Notes (Signed)
Nausea x 3 days. Vomiting today unable to keep anything down. New pregnancy unsure how far along  LMP may 9-14. Prescribed reglan, no relief.

## 2021-11-15 NOTE — ED Notes (Signed)
3 days feeling Nauseated. Today vomited 3 times. Nothing to eat/drink all day, everything is vomited up. No complaint of abdominal pain, trouble urinating or any other problem.   Pt stated that she had bloody vaginal discharge about 3 weeks prior. Ultrasound done, no obvious fetus. HCG in the 500.  Few days later HCG over a thousand.  Current as of 3 days prior HCG roughly 40000.

## 2021-11-15 NOTE — ED Provider Notes (Signed)
[facility] EMERGENCY DEPT Provider Note   CSN: [id number] Arrival date & time: 11/15/21  1741     History  Chief Complaint  Patient presents with   Nausea    Lynae Pederson is a 21 y.o. female.  HPI  Patient is a 21 year old female presented to the emergency room today with complaints of nausea over the past 3 to 4 days states that she has had several episodes of nonbloody nonbilious emesis today.  She has no medications for nausea at home other than doxylamine which she was given by an OB/GYN and states that it has not been working.  She states she has had an ultrasound done before and is scheduled for an ultrasound next week however states that she has not had 1 that showed the baby yet.  She denies any pain and denies any vaginal bleeding no other associate symptoms apart from nausea.  She states she is actually feeling less nauseous currently.   No vaginal discharge, no urinary frequency urgency dysuria hematuria.    Home Medications Prior to Admission medications   Medication Sig Start Date End Date Taking? Authorizing Provider  promethazine (PHENERGAN) 25 MG tablet Take 1 tablet (25 mg total) by mouth every 6 (six) hours as needed for nausea or vomiting. 11/15/21  Yes Martha Soltys S, PA  vitamin B-6 (PYRIDOXINE) 25 MG tablet Take 1 tablet (25 mg total) by mouth every 6 (six) hours as needed. 11/15/21  Yes Brittini Brubeck S, PA  cephALEXin (KEFLEX) 500 MG capsule Take 1 capsule (500 mg total) by mouth 3 (three) times daily. 03/04/21   Horton, Mayer Masker, MD  Doxylamine-Pyridoxine 10-10 MG TBEC Take 2 tablets by mouth at bedtime. 03/07/21   Terrilee Files, MD  ibuprofen (ADVIL) 800 MG tablet Take 1 tablet (800 mg total) by mouth 3 (three) times daily. 03/29/21   Arby Barrette, MD  meloxicam (MOBIC) 7.5 MG tablet Take 1 tablet (7.5 mg total) by mouth daily. Patient not taking: Reported on 03/05/2021 01/17/17   Cuthriell, Delorise Royals, PA-C  ondansetron (ZOFRAN  ODT) 4 MG disintegrating tablet Take 1 tablet (4 mg total) by mouth every 8 (eight) hours as needed for nausea or vomiting. 03/05/21   Kommor, Madison, MD  ondansetron (ZOFRAN-ODT) 4 MG disintegrating tablet Take 1 tablet (4 mg total) by mouth every 4 (four) hours as needed for nausea or vomiting. 03/29/21   Arby Barrette, MD  Prenatal Vit-Fe Fumarate-FA (PRENATAL COMPLETE) 14-0.4 MG TABS Take 1 tablet by mouth daily. 03/04/21   Horton, Mayer Masker, MD      Allergies    Amoxicillin    Review of Systems   Review of Systems  Physical Exam Updated Vital Signs BP 114/68 (BP Location: Right Arm)   Pulse 72   Temp 98.2 F (36.8 C) (Oral)   Resp 16   LMP 09/10/2021 (Exact Date)   SpO2 100%  Physical Exam Vitals and nursing note reviewed.  Constitutional:      General: She is not in acute distress.    Comments: Pleasant well-appearing 21 year old.  In no acute distress.  Sitting comfortably in bed.  Able answer questions appropriately follow commands. No increased work of breathing. Speaking in full sentences.   HENT:     Head: Normocephalic and atraumatic.     Nose: Nose normal.  Eyes:     General: No scleral icterus. Cardiovascular:     Rate and Rhythm: Normal rate and regular rhythm.     Pulses: Normal pulses.  Heart sounds: Normal heart sounds.  Pulmonary:     Effort: Pulmonary effort is normal. No respiratory distress.     Breath sounds: No wheezing.  Abdominal:     Palpations: Abdomen is soft.     Tenderness: There is no abdominal tenderness.  Musculoskeletal:     Cervical back: Normal range of motion.     Right lower leg: No edema.     Left lower leg: No edema.  Skin:    General: Skin is warm and dry.     Capillary Refill: Capillary refill takes less than 2 seconds.  Neurological:     Mental Status: She is alert. Mental status is at baseline.  Psychiatric:        Mood and Affect: Mood normal.        Behavior: Behavior normal.    ED Results / Procedures /  Treatments   Labs (all labs ordered are listed, but only abnormal results are displayed) Labs Reviewed  URINALYSIS, ROUTINE W REFLEX MICROSCOPIC - Abnormal; Notable for the following components:      Result Value   APPearance HAZY (*)    Specific Gravity, Urine 1.036 (*)    Ketones, ur >80 (*)    Protein, ur 30 (*)    Leukocytes,Ua TRACE (*)    All other components within normal limits  PREGNANCY, URINE - Abnormal; Notable for the following components:   Preg Test, Ur POSITIVE (*)    All other components within normal limits  COMPREHENSIVE METABOLIC PANEL - Abnormal; Notable for the following components:   Sodium 132 (*)    CO2 21 (*)    Calcium 10.4 (*)    Total Protein 8.2 (*)    Total Bilirubin 1.7 (*)    All other components within normal limits  HCG, QUANTITATIVE, PREGNANCY - Abnormal; Notable for the following components:   hCG, Beta Chain, Quant, S 72,795 (*)    All other components within normal limits  CBC WITH DIFFERENTIAL/PLATELET    EKG None  Radiology No results found.  Procedures Procedures    Medications Ordered in ED Medications  lactated ringers bolus 1,000 mL (0 mLs Intravenous Stopped 11/15/21 2210)  promethazine (PHENERGAN) 12.5 mg in sodium chloride 0.9 % 50 mL IVPB (0 mg Intravenous Stopped 11/15/21 2134)    ED Course/ Medical Decision Making/ A&P Clinical Course as of 11/16/21 1803  Fri Nov 15, 2021  1958 LMP may 9-14th Nauseated last 3 days vomiting today NBNB.  No medications for sx today but has been trying doxalamine.  [WF]  2000 550.7 October 31 1339.9 June 30th 41540.4 July 12th  [WF]  2001 On the 27th will have US done at [facility].  [WF]    Clinical Course User Index [WF] Gailen Shelter, [place]                           Medical Decision Making Amount and/or Complexity of Data Reviewed Labs: ordered.  Risk OTC drugs. Prescription drug management.   This patient presents to the ED for concern of nausea vomiting,  this involves a number of treatment options, and is a complaint that carries with it a moderate risk of complications and morbidity.  The differential diagnosis includes most likely morning sickness.  She is pregnant she is not having any vaginal pain or vaginal bleeding or abdominal pain.   Co morbidities: Discussed in HPI   Brief History:  Patient is a 21 year old female presented  to the emergency room today with complaints of nausea over the past 3 to 4 days states that she has had several episodes of nonbloody nonbilious emesis today.  She has no medications for nausea at home other than doxylamine which she was given by an OB/GYN and states that it has not been working.  She states she has had an ultrasound done before and is scheduled for an ultrasound next week however states that she has not had 1 that showed the baby yet.  She denies any pain and denies any vaginal bleeding no other associate symptoms apart from nausea.  She states she is actually feeling less nauseous currently.   No vaginal discharge, no urinary frequency urgency dysuria hematuria.    EMR reviewed including pt PMHx, past surgical history and past visits to ER.   See HPI for more details   Lab Tests:   I ordered and independently interpreted labs. Labs notable for CMP with no significant abnormalities apart from mild hyponatremia.  Will hydrate here with IV fluids in the form of 1 L of crystalloid.  CBC unremarkable urine pregnancy positive.  Urinalysis with some ketones.  With the presence of ketones and nausea vomiting will have bedside RN place PIV and hydrate and provide antiemetic.   Imaging Studies:  No imaging studies ordered for this patient    Cardiac Monitoring:      Medicines ordered:  I ordered medication including Phenergan, 1 L LR for nausea vomiting dehydration Reevaluation of the patient after these medicines showed that the patient resolved I have reviewed the patients home  medicines and have made adjustments as needed   Critical Interventions:     Consults/Attending Physician   I discussed this case with my attending physician who cosigned this note including patient's presenting symptoms, physical exam, and planned diagnostics and interventions. Attending physician stated agreement with plan or made changes to plan which were implemented.   Reevaluation:  After the interventions noted above I re-evaluated patient and found that they have :resolved   Social Determinants of Health:      Problem List / ED Course:  Nausea vomiting.  Patient is pregnant and seems to experiencing morning sickness.  Doxylamine at home has not improved her symptoms.  Given 1 dose of Phenergan here with complete resolution.  She was hydrated by IV.  She is tolerating p.o. now.  Recommend close follow-up with OB/GYN.  Will discharge home at this time.  Doubt ectopic pregnancy given no symptoms of pain, bleeding, or any other discomfort.   Dispostion:  After consideration of the diagnostic results and the patients response to treatment, I feel that the patent would benefit from close OB/GYN follow-up.  Return precautions.  Will discharge home with Phenergan to use as a last resort but recommend doxylamine and B6 vitamins.    Final Clinical Impression(s) / ED Diagnoses Final diagnoses:  Nausea and vomiting during pregnancy    Rx / DC Orders ED Discharge Orders          Ordered    vitamin B-6 (PYRIDOXINE) 25 MG tablet  Every 6 hours PRN        11/15/21 2145    promethazine (PHENERGAN) 25 MG tablet  Every 6 hours PRN        11/15/21 2145              Gailen Shelter, [place] 11/16/21 1911    Vanetta Mulders, MD 11/22/21 1645

## 2021-11-15 NOTE — ED Notes (Signed)
Pt drank water, no N/V.

## 2021-11-30 IMAGING — US US OB < 14 WEEKS - US OB TV
1 series · 13 of 28 positions shown · non-contrast
Comparison: None.

CLINICAL DATA: 20-year-old pregnant female presents with 2 days of
vaginal bleeding and pelvic cramping. Quantitative beta HCG pending.
EDC by LMP: 10/15/2021, projecting to an expected gestational age of
11 weeks 3 days.

EXAM:
OBSTETRIC <14 WK US AND TRANSVAGINAL OB US
TECHNIQUE: Both transabdominal and transvaginal ultrasound examinations were
performed for complete evaluation of the gestation as well as the
maternal uterus, adnexal regions, and pelvic cul-de-sac.
Transvaginal technique was performed to assess early pregnancy.

[Series 1: us ob less than 14 weeks with ob transvaginal · 13 of 150 slices shown]
[im 6/150]
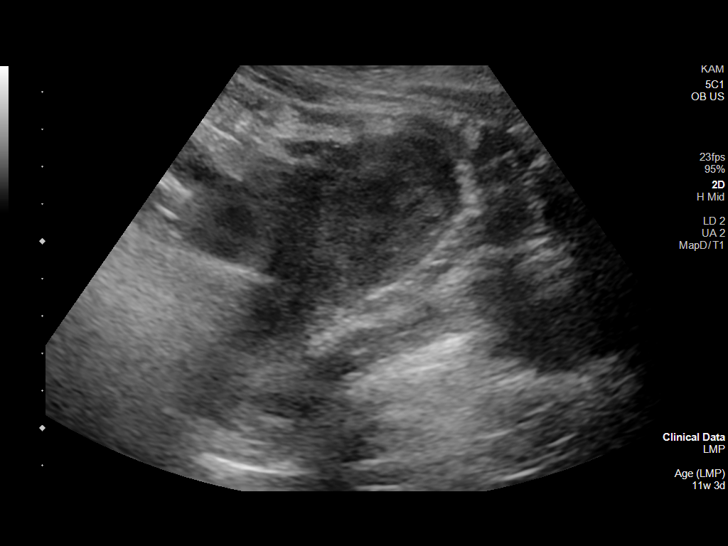
[im 17/150]
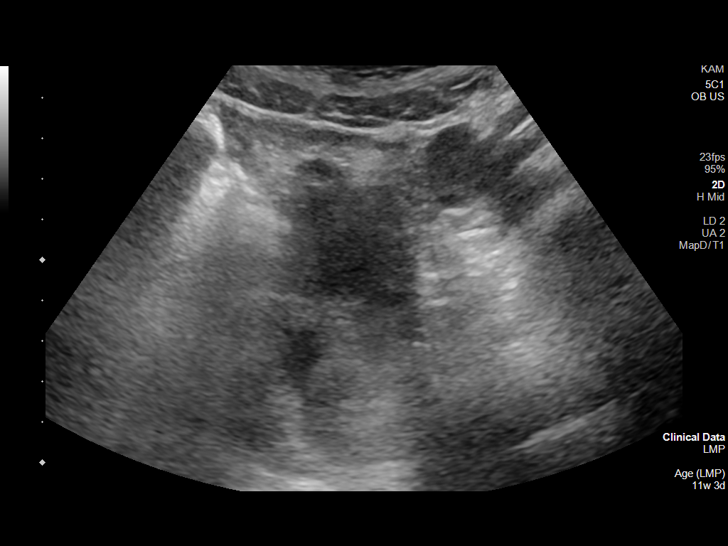
[im 28/150]
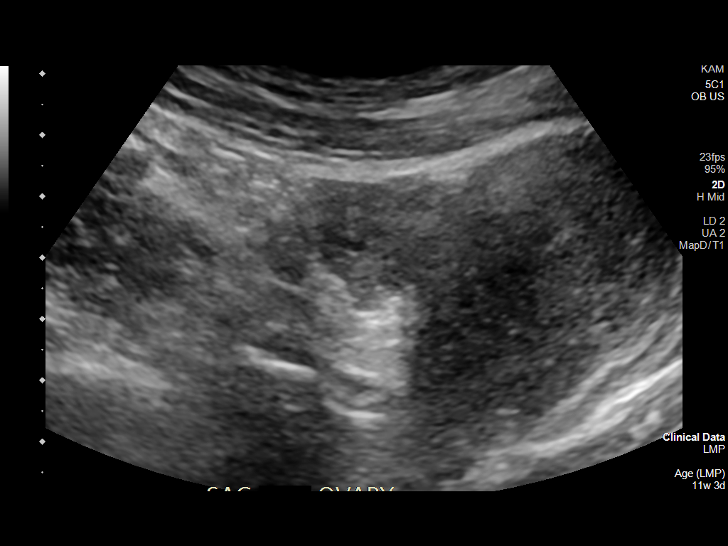
[im 39/150]
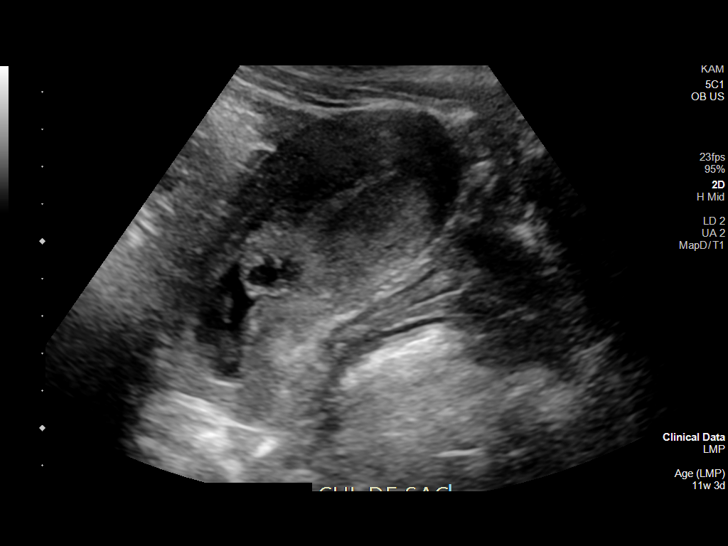
[im 50/150]
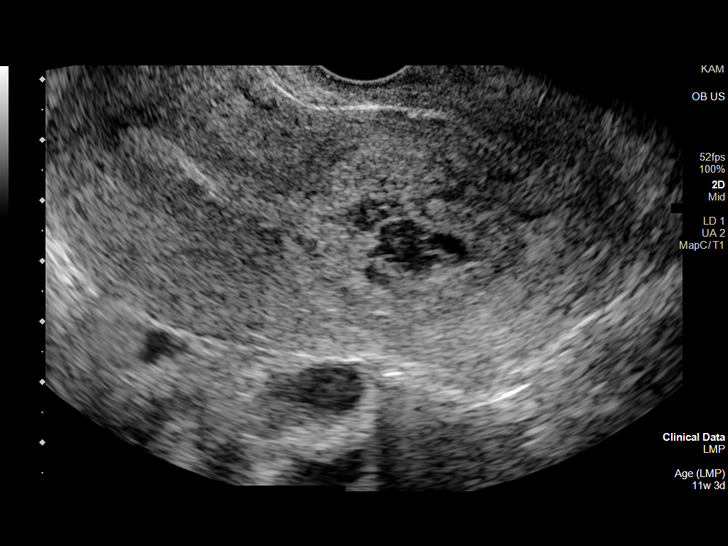
[im 61/150]
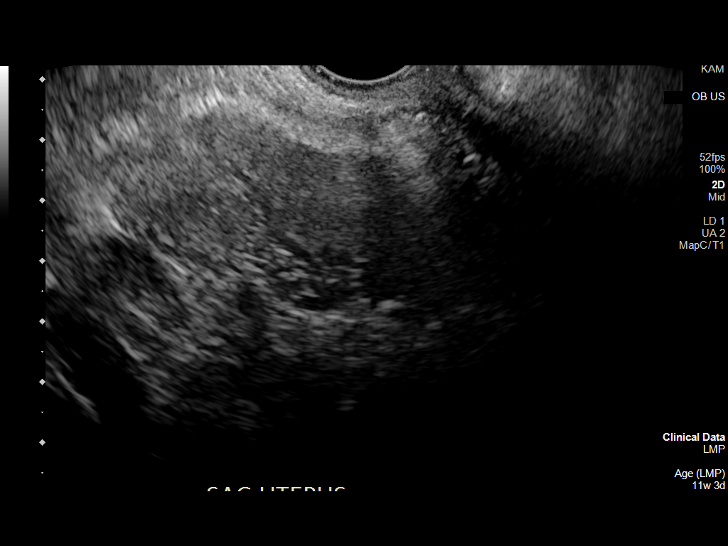
[im 78/150]
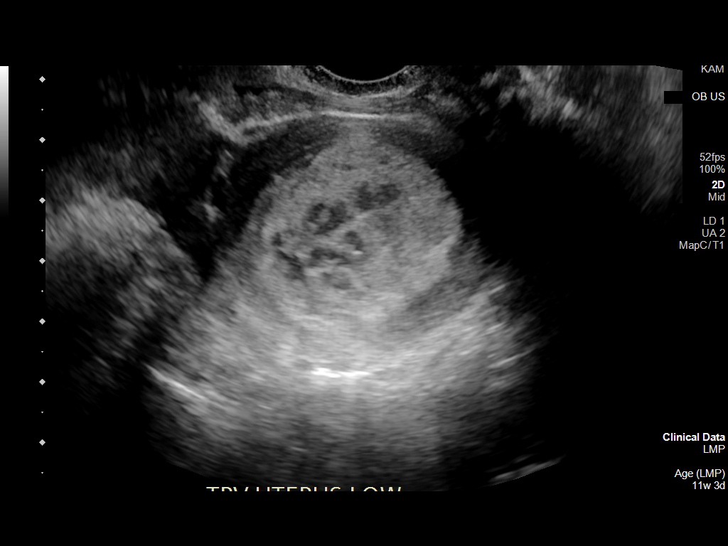
[im 89/150]
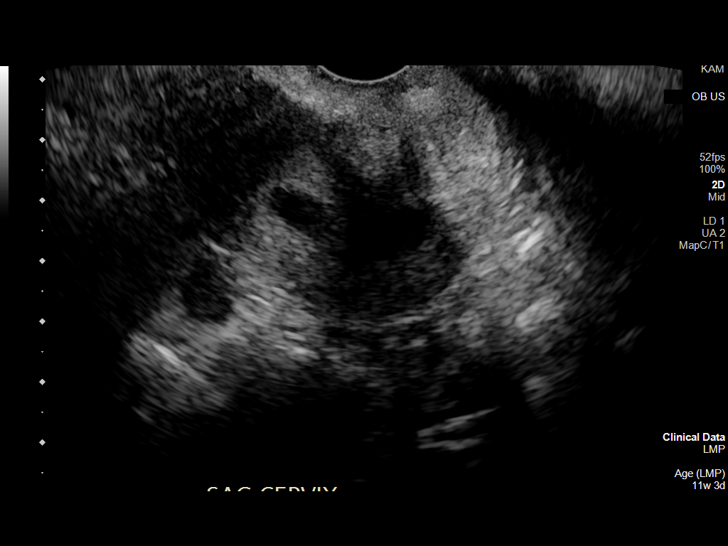
[im 100/150]
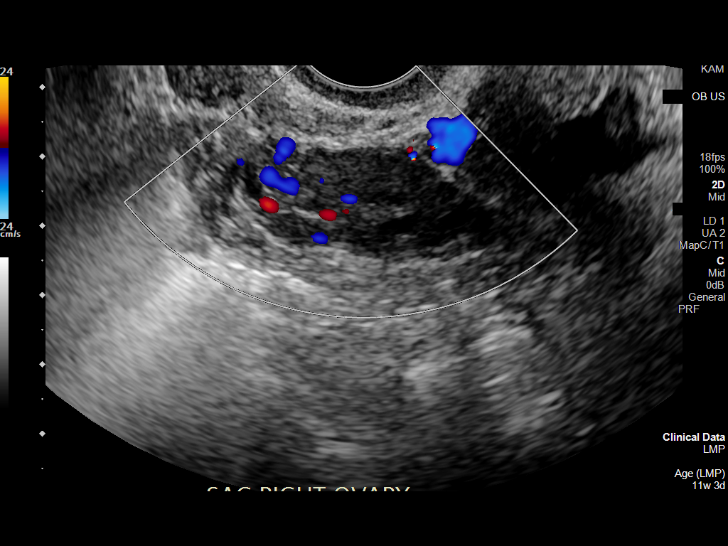
[im 111/150]
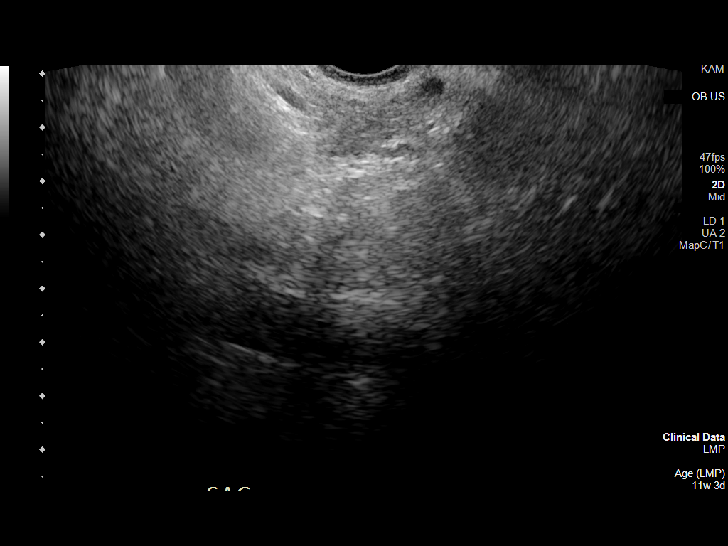
[im 122/150]
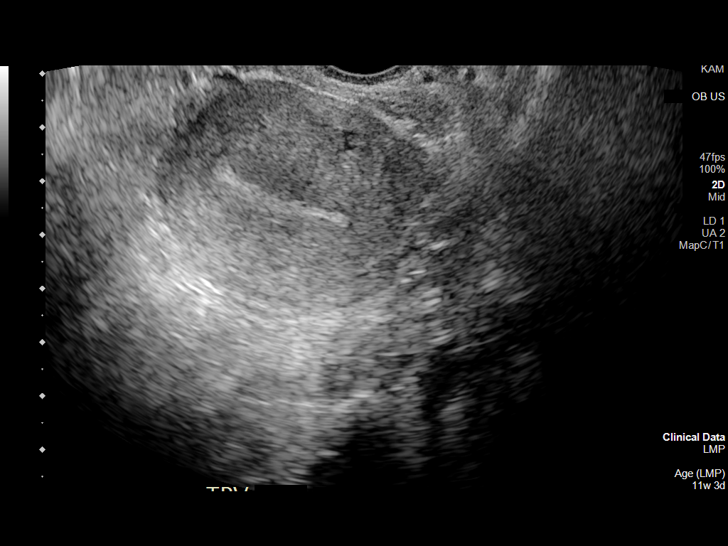
[im 133/150]
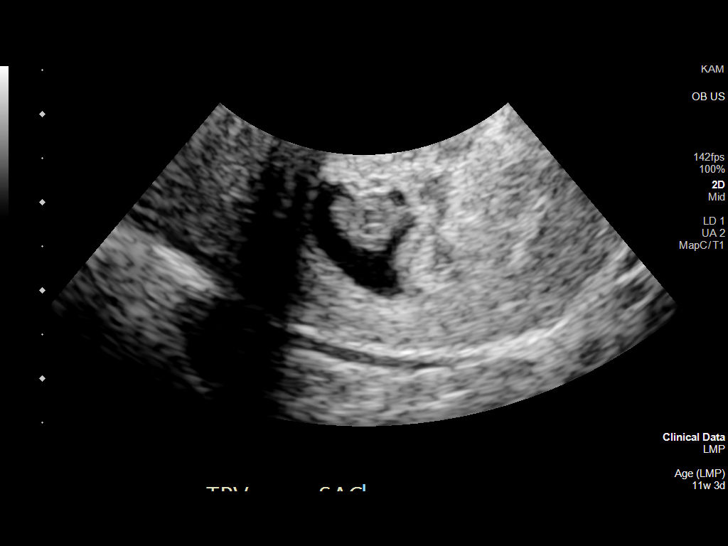
[im 144/150]
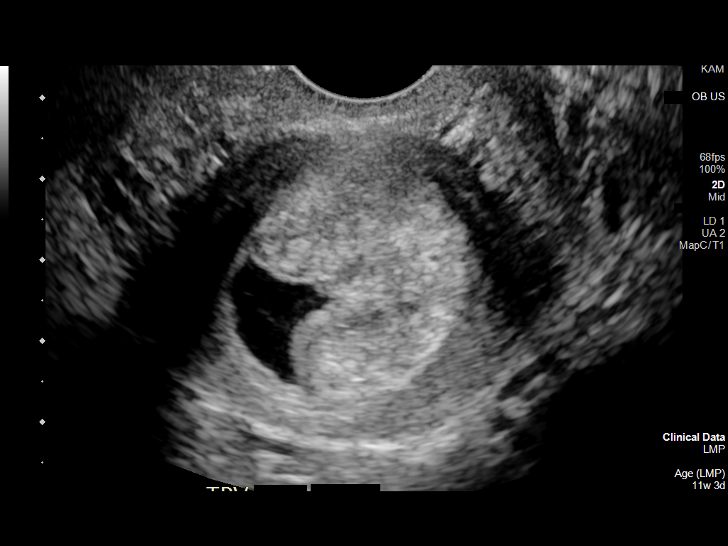

[13 of 28 positions shown; findings below may reference images not displayed]

FINDINGS: Anteverted uterus measures 10.3 x 4.7 x 5.6 cm. No uterine fibroids.

Irregular gestational sac is located in the endocervical canal.
Embryo identified within the yolk sac measuring 10.3 mm by
crown-rump length, correlating to a gestational age of 7 weeks 1
day. No yolk sac identified. Heterogeneous debris within the
irregular yolk sac. No embryonic cardiac activity.

Right ovary measures 4.2 x 1.9 x 2.5 cm and is normal. Left ovary is
seen only on the transabdominal images, measuring 3.4 x 2.0 x
cm, normal. No abnormal ovarian or adnexal masses. No abnormal free
fluid in the pelvis.
IMPRESSION: Sonographic findings are compatible with spontaneous abortion in
progress, with irregular gestational sac in the endocervical canal
with no yolk sac and with embryo measuring 7 weeks 1 day by
crown-rump length. No embryonic cardiac activity, compatible with
non-viable gestation.

Normal ovaries. No adnexal masses. No abnormal free fluid in the
pelvis.

## 2021-12-09 ENCOUNTER — Emergency Department (HOSPITAL_BASED_OUTPATIENT_CLINIC_OR_DEPARTMENT_OTHER): Payer: Medicaid Other

## 2021-12-09 ENCOUNTER — Encounter (HOSPITAL_BASED_OUTPATIENT_CLINIC_OR_DEPARTMENT_OTHER): Payer: Self-pay

## 2021-12-09 ENCOUNTER — Other Ambulatory Visit: Payer: Self-pay

## 2021-12-09 ENCOUNTER — Emergency Department (HOSPITAL_BASED_OUTPATIENT_CLINIC_OR_DEPARTMENT_OTHER)
Admission: EM | Admit: 2021-12-09 | Discharge: 2021-12-09 | Disposition: A | Discharge status: Home / Self Care | Payer: Medicaid Other | Attending: Emergency Medicine | Admitting: Emergency Medicine

## 2021-12-09 DIAGNOSIS — O039 Complete or unspecified spontaneous abortion without complication: Secondary | ICD-10-CM | POA: Diagnosis present

## 2021-12-09 LAB — URINALYSIS, ROUTINE W REFLEX MICROSCOPIC
Bilirubin Urine: NEGATIVE
Glucose, UA: NEGATIVE mg/dL
Ketones, ur: NEGATIVE mg/dL
Leukocytes,Ua: NEGATIVE
Nitrite: NEGATIVE
Protein, ur: NEGATIVE mg/dL
Specific Gravity, Urine: 1.024 (ref 1.005–1.030)
pH: 6 (ref 5.0–8.0)

## 2021-12-09 LAB — HCG, QUANTITATIVE, PREGNANCY: hCG, Beta Chain, Quant, S: 31123 m[IU]/mL — ABNORMAL HIGH (ref <5)

## 2021-12-09 LAB — WET PREP, GENITAL
Clue Cells Wet Prep HPF POC: NONE SEEN
Sperm: NONE SEEN
Trich, Wet Prep: NONE SEEN
WBC, Wet Prep HPF POC: <10 (ref <10)
Yeast Wet Prep HPF POC: NONE SEEN

## 2021-12-09 LAB — PREGNANCY, URINE: Preg Test, Ur: POSITIVE — AB

## 2021-12-09 NOTE — ED Notes (Signed)
Discharge instructions and follow up care reviewed and explained, pt verbalized understanding and had no further questions on d/c. Pt caox4 and ambulatory on d/c.  

## 2021-12-09 NOTE — ED Triage Notes (Signed)
Pt. States vaginal discharge started Saturday. States spotting after urinating color was pink. States happened this am with dark brown discharge and light pink in the pad. States she goes to [place] with no abnormality reports.

## 2021-12-09 NOTE — ED Provider Notes (Signed)
[facility] EMERGENCY DEPT Provider Note   CSN: [id number] Arrival date & time: 12/09/21  1035     History  Chief Complaint  Patient presents with   Vaginal Discharge    Elizabeth Wheeler is a 21 y.o. female G2, P0 A1 who presents emergency department for evaluation of vaginal discharge and bleeding that started Saturday.  Patient is [redacted] weeks pregnant and follows with [facility].  She had an ultrasound done last month along with her first appointment and everything was progressing normally.  She states she noticed some pink vaginal spotting 2 days ago and then this morning she noticed dark brown discharged and then moderate amounts of vaginal bleeding while she was giving a urine sample here in the emergency department.  She states at this point, she needs a pad due to the degree of bleeding.  She denies dysuria, hematuria, vaginal pain, abdominal pain, vomiting, diarrhea and constipation.   Vaginal Discharge Associated symptoms: no abdominal pain, no dysuria, no fever and no vomiting        Home Medications Prior to Admission medications   Medication Sig Start Date End Date Taking? Authorizing Provider  cephALEXin (KEFLEX) 500 MG capsule Take 1 capsule (500 mg total) by mouth 3 (three) times daily. 03/04/21   Horton, Mayer Masker, MD  Doxylamine-Pyridoxine 10-10 MG TBEC Take 2 tablets by mouth at bedtime. 03/07/21   Terrilee Files, MD  ibuprofen (ADVIL) 800 MG tablet Take 1 tablet (800 mg total) by mouth 3 (three) times daily. 03/29/21   Arby Barrette, MD  meloxicam (MOBIC) 7.5 MG tablet Take 1 tablet (7.5 mg total) by mouth daily. Patient not taking: Reported on 03/05/2021 01/17/17   Cuthriell, Delorise Royals, PA-C  ondansetron (ZOFRAN ODT) 4 MG disintegrating tablet Take 1 tablet (4 mg total) by mouth every 8 (eight) hours as needed for nausea or vomiting. 03/05/21   Kommor, Madison, MD  ondansetron (ZOFRAN-ODT) 4 MG disintegrating tablet Take 1 tablet (4 mg  total) by mouth every 4 (four) hours as needed for nausea or vomiting. 03/29/21   Arby Barrette, MD  Prenatal Vit-Fe Fumarate-FA (PRENATAL COMPLETE) 14-0.4 MG TABS Take 1 tablet by mouth daily. 03/04/21   Horton, Mayer Masker, MD  promethazine (PHENERGAN) 25 MG tablet Take 1 tablet (25 mg total) by mouth every 6 (six) hours as needed for nausea or vomiting. 11/15/21   Gailen Shelter, PA  vitamin B-6 (PYRIDOXINE) 25 MG tablet Take 1 tablet (25 mg total) by mouth every 6 (six) hours as needed. 11/15/21   Gailen Shelter, PA      Allergies    Amoxicillin    Review of Systems   Review of Systems  Constitutional:  Negative for fever.  Gastrointestinal:  Negative for abdominal pain, diarrhea and vomiting.  Genitourinary:  Positive for vaginal bleeding and vaginal discharge. Negative for dysuria, hematuria and pelvic pain.    Physical Exam Updated Vital Signs BP 127/79   Pulse 78   Temp 99 F (37.2 C) (Oral)   Resp 20   Ht 5\' 4"  (1.626 m)   Wt 51.7 kg   LMP 09/10/2021 (Exact Date)   SpO2 100%   BMI 19.57 kg/m  Physical Exam Vitals and nursing note reviewed. Exam conducted with a chaperone present.  Constitutional:      General: She is not in acute distress.    Appearance: She is not ill-appearing.  HENT:     Head: Atraumatic.  Eyes:     Conjunctiva/sclera: Conjunctivae normal.  Cardiovascular:     Rate and Rhythm: Normal rate and regular rhythm.     Pulses: Normal pulses.     Heart sounds: No murmur heard. Pulmonary:     Effort: Pulmonary effort is normal. No respiratory distress.     Breath sounds: Normal breath sounds.  Abdominal:     General: Abdomen is flat. There is no distension.     Palpations: Abdomen is soft.     Tenderness: There is no abdominal tenderness.  Genitourinary:    Vagina: No tenderness.     Cervix: Cervical bleeding present. No cervical motion tenderness or discharge.     Comments: Moderate amounts of blood with a  few clots. Possible dilation of  os Musculoskeletal:        General: Normal range of motion.     Cervical back: Normal range of motion.  Skin:    General: Skin is warm and dry.     Capillary Refill: Capillary refill takes less than 2 seconds.  Neurological:     General: No focal deficit present.     Mental Status: She is alert.  Psychiatric:        Mood and Affect: Mood normal.     ED Results / Procedures / Treatments   Labs (all labs ordered are listed, but only abnormal results are displayed) Labs Reviewed  URINALYSIS, ROUTINE W REFLEX MICROSCOPIC - Abnormal; Notable for the following components:      Result Value   Hgb urine dipstick LARGE (*)    All other components within normal limits  PREGNANCY, URINE - Abnormal; Notable for the following components:   Preg Test, Ur POSITIVE (*)    All other components within normal limits  HCG, QUANTITATIVE, PREGNANCY - Abnormal; Notable for the following components:   hCG, Beta Chain, Quant, S 31,123 (*)    All other components within normal limits  WET PREP, GENITAL  GC/CHLAMYDIA PROBE AMP ([place]) NOT AT [facility]    EKG None  Radiology US OB Comp Less 14 Wks  Result Date: 12/09/2021 CLINICAL DATA:  Vaginal bleeding.  Decreasing beta HCG EXAM: OBSTETRIC <14 WK ULTRASOUND TECHNIQUE: Transabdominal ultrasound was performed for evaluation of the gestation as well as the maternal uterus and adnexal regions. COMPARISON:  None Available. FINDINGS: Intrauterine gestational sac: Single Yolk sac:  Not Visualized. Embryo:  Visualized. Cardiac Activity: Not Visualized. Heart Rate: 0 bpm CRL:   18.7 mm   8 w 3 d                  [place] EDC: Subchorionic hemorrhage:  None visualized. Maternal uterus/adnexae: Bilateral ovaries and adnexal regions within normal limits. No free fluid within the pelvis. IMPRESSION: Intrauterine gestational sac containing embryo with no cardiac activity. Findings meet definitive criteria for failed pregnancy. This follows SRU consensus guidelines:  Diagnostic Criteria for Nonviable Pregnancy Early in the First Trimester. Alison Stalling J Med [phone]. Electronically Signed   By: Davina Poke D.O.   On: 12/09/2021 13:37    Procedures Procedures    Medications Ordered in ED Medications - No data to display  ED Course/ Medical Decision Making/ A&P                           Medical Decision Making Amount and/or Complexity of Data Reviewed Labs: ordered. Radiology: ordered.   21 year old pregnant female presents to the emergency department for evaluation of vaginal bleeding that started 2 days ago and has become  increasingly worse.  Differentials include subchorionic hematoma, miscarriage, trauma.  Vitals without significant abnormality.  On exam, patient is well-appearing and in no acute distress.  She has no abdominal tenderness.  On GU exam, there was moderate amounts of blood noted within the vaginal canal and possible dilation of the cervical os.  I ordered OB ultrasound which shows a fetus with no cardiac activity consistent with a failed pregnancy.  hCG quant has gone down to 31,000 and from the mid 70,000s a couple of weeks ago.  Discussed results with patient and advised on typical course of miscarriage.  Advised to follow-up with her OB/GYN either today or tomorrow for evaluation.  Patient expresses understanding is amenable to plan.  Discharged home in good condition.  Final Clinical Impression(s) / ED Diagnoses Final diagnoses:  Miscarriage at 8 to [redacted] weeks gestation    Rx / DC Orders ED Discharge Orders     None         Janell Quiet, [place] 12/09/21 1655    Pricilla Loveless, MD 12/10/21 [phone]

## 2021-12-09 NOTE — Discharge Instructions (Signed)
I am so sorry to tell you that your ultrasound has shown that you are currently having a miscarriage.  I attached some information here for you regarding miscarriage, however would like you to call your OB/GYN to talk to them directly.  You may notice some bleeding ongoing over the next several days.  Again I am so sorry for your loss.

## 2021-12-10 ENCOUNTER — Other Ambulatory Visit: Payer: Self-pay

## 2021-12-10 ENCOUNTER — Encounter (HOSPITAL_BASED_OUTPATIENT_CLINIC_OR_DEPARTMENT_OTHER): Payer: Self-pay

## 2021-12-10 DIAGNOSIS — Z3A10 10 weeks gestation of pregnancy: Secondary | ICD-10-CM | POA: Diagnosis not present

## 2021-12-10 DIAGNOSIS — Z5321 Procedure and treatment not carried out due to patient leaving prior to being seen by health care provider: Secondary | ICD-10-CM | POA: Diagnosis not present

## 2021-12-10 DIAGNOSIS — O209 Hemorrhage in early pregnancy, unspecified: Secondary | ICD-10-CM | POA: Insufficient documentation

## 2021-12-10 LAB — GC/CHLAMYDIA PROBE AMP (~~LOC~~) NOT AT ARMC
Chlamydia: NEGATIVE
Comment: NEGATIVE
Comment: NORMAL
Neisseria Gonorrhea: NEGATIVE

## 2021-12-10 NOTE — ED Notes (Signed)
Pt placed in WC, pt is hyperventilating, instructed to take slow deep breaths and emesis bag given.  Significant other is present

## 2021-12-10 NOTE — ED Triage Notes (Signed)
Pt states she is approx [redacted] weeks pregnant and is bleeding.  Pain is worse than bleeding. 10/10 N/V all evening

## 2021-12-11 ENCOUNTER — Emergency Department (HOSPITAL_BASED_OUTPATIENT_CLINIC_OR_DEPARTMENT_OTHER)
Admission: EM | Admit: 2021-12-11 | Discharge: 2021-12-11 | Discharge status: Against Medical Advice | Payer: Medicaid Other | Attending: Emergency Medicine | Admitting: Emergency Medicine

## 2022-04-24 ENCOUNTER — Ambulatory Visit: Payer: Self-pay

## 2022-04-24 NOTE — Telephone Encounter (Signed)
    Chief Complaint: White vaginal discharge, mild itching Symptoms: Above Frequency: Yesterday Pertinent Negatives: Patient denies any rash or sores Disposition: [] ED /[] Urgent Care (no appt availability in office) / [] Appointment(In office/virtual)/ []  [facility]/ [x] Home Care/ [] Refused Recommended Disposition /[] [facility]/ []  Follow-up with PCP Additional Notes: If no better in 5-7 days, see UC.  Reason for Disposition  [1] Symptoms of a yeast infection (i.e., itchy, white discharge, not bad smelling) AND [2] feels like prior vaginal yeast infections  Answer Assessment - Initial Assessment Questions 1. SYMPTOM: "What's the main symptom you're concerned about?" (e.g., pain, itching, dryness)     Inside itching 2. LOCATION: "Where is the   located?" (e.g., inside/outside, left/right)     inside 3. ONSET: "When did the    start?"     This week 4. PAIN: "Is there any pain?" If Yes, ask: "How bad is it?" (Scale: 1-10; mild, moderate, severe)   -  MILD (1-3): Doesn't interfere with normal activities.    -  MODERATE (4-7): Interferes with normal activities (e.g., work or school) or awakens from sleep.     -  SEVERE (8-10): Excruciating pain, unable to do any normal activities.     Discomfort 5. ITCHING: "Is there any itching?" If Yes, ask: "How bad is it?" (Scale: 1-10; mild, moderate, severe)     Yes 6. CAUSE: "What do you think is causing the discharge?" "Have you had the same problem before? What happened then?"     Yeast 7. OTHER SYMPTOMS: "Do you have any other symptoms?" (e.g., fever, itching, vaginal bleeding, pain with urination, injury to genital area, vaginal foreign body)     No 8. PREGNANCY: "Is there any chance you are pregnant?" "When was your last menstrual period?"     No  Protocols used: Vaginal Symptoms-A-AH

## 2022-04-24 NOTE — Telephone Encounter (Signed)
Summary: Pt would like to discuss otc med for yeast infection   Pt stated she feels she may have a yeast infection and she would like to discuss possible otc medications that she can try. Pt requests call back to discuss. Cb# [phone]     Left message to call back about symptoms.

## 2022-05-15 ENCOUNTER — Other Ambulatory Visit: Payer: Self-pay

## 2022-05-15 ENCOUNTER — Inpatient Hospital Stay (HOSPITAL_COMMUNITY): Payer: Medicaid Other

## 2022-05-15 ENCOUNTER — Inpatient Hospital Stay (HOSPITAL_COMMUNITY)
Admission: AD | Admit: 2022-05-15 | Discharge: 2022-05-15 | Disposition: A | Discharge status: Home / Self Care | Payer: Medicaid Other | Attending: Obstetrics and Gynecology | Admitting: Obstetrics and Gynecology

## 2022-05-15 ENCOUNTER — Encounter (HOSPITAL_COMMUNITY): Payer: Self-pay | Admitting: Obstetrics and Gynecology

## 2022-05-15 DIAGNOSIS — O26891 Other specified pregnancy related conditions, first trimester: Secondary | ICD-10-CM | POA: Diagnosis not present

## 2022-05-15 DIAGNOSIS — O26899 Other specified pregnancy related conditions, unspecified trimester: Secondary | ICD-10-CM

## 2022-05-15 DIAGNOSIS — R109 Unspecified abdominal pain: Secondary | ICD-10-CM | POA: Diagnosis not present

## 2022-05-15 DIAGNOSIS — Z3A01 Less than 8 weeks gestation of pregnancy: Secondary | ICD-10-CM

## 2022-05-15 DIAGNOSIS — O3680X0 Pregnancy with inconclusive fetal viability, not applicable or unspecified: Secondary | ICD-10-CM | POA: Diagnosis present

## 2022-05-15 LAB — CBC
HCT: 36.1 % (ref 36.0–46.0)
Hemoglobin: 12.8 g/dL (ref 12.0–15.0)
MCH: 30.8 pg (ref 26.0–34.0)
MCHC: 35.5 g/dL (ref 30.0–36.0)
MCV: 86.8 fL (ref 80.0–100.0)
Platelets: 394 K/uL (ref 150–400)
RBC: 4.16 MIL/uL (ref 3.87–5.11)
RDW: 12.1 % (ref 11.5–15.5)
WBC: 8.5 K/uL (ref 4.0–10.5)
nRBC: 0 % (ref 0.0–0.2)

## 2022-05-15 LAB — URINALYSIS, ROUTINE W REFLEX MICROSCOPIC
Bilirubin Urine: NEGATIVE
Glucose, UA: NEGATIVE mg/dL
Hgb urine dipstick: NEGATIVE
Ketones, ur: NEGATIVE mg/dL
Leukocytes,Ua: NEGATIVE
Nitrite: NEGATIVE
Protein, ur: NEGATIVE mg/dL
Specific Gravity, Urine: 1.008 (ref 1.005–1.030)
pH: 7 (ref 5.0–8.0)

## 2022-05-15 LAB — POCT PREGNANCY, URINE: Preg Test, Ur: POSITIVE — AB

## 2022-05-15 LAB — WET PREP, GENITAL
Clue Cells Wet Prep HPF POC: NONE SEEN
Sperm: NONE SEEN
Trich, Wet Prep: NONE SEEN
WBC, Wet Prep HPF POC: <10 (ref <10)
Yeast Wet Prep HPF POC: NONE SEEN

## 2022-05-15 LAB — HCG, QUANTITATIVE, PREGNANCY: hCG, Beta Chain, Quant, S: 669 m[IU]/mL — ABNORMAL HIGH (ref <5)

## 2022-05-15 NOTE — MAU Note (Signed)
Elizabeth Wheeler is a 22 y.o. at [redacted]w[redacted]d here in MAU reporting: started cramping today and has been going on longer then 30 min and is concerned due to hx of miscarriage. No bleeding. Was Rx metrogel for BV but stopped after 5 days.   LMP: 04/10/22  Onset of complaint: today  Pain score: 4/10  Vitals:   05/15/22 1405  BP: 123/85  Pulse: 96  Resp: 16  Temp: 98.7 F (37.1 C)  SpO2: 99%     FHT:NA  Lab orders placed from triage: ua, upt

## 2022-05-16 ENCOUNTER — Encounter (HOSPITAL_COMMUNITY): Payer: Self-pay | Admitting: Obstetrics and Gynecology

## 2022-05-16 ENCOUNTER — Inpatient Hospital Stay (HOSPITAL_COMMUNITY): Payer: Medicaid Other

## 2022-05-16 ENCOUNTER — Inpatient Hospital Stay (HOSPITAL_COMMUNITY)
Admission: AD | Admit: 2022-05-16 | Discharge: 2022-05-16 | Disposition: A | Discharge status: Home / Self Care | Payer: Medicaid Other | Attending: Obstetrics and Gynecology | Admitting: Obstetrics and Gynecology

## 2022-05-16 DIAGNOSIS — Z3A01 Less than 8 weeks gestation of pregnancy: Secondary | ICD-10-CM | POA: Diagnosis not present

## 2022-05-16 DIAGNOSIS — Z87891 Personal history of nicotine dependence: Secondary | ICD-10-CM | POA: Diagnosis not present

## 2022-05-16 DIAGNOSIS — R102 Pelvic and perineal pain: Secondary | ICD-10-CM | POA: Insufficient documentation

## 2022-05-16 DIAGNOSIS — O26891 Other specified pregnancy related conditions, first trimester: Secondary | ICD-10-CM | POA: Diagnosis present

## 2022-05-16 DIAGNOSIS — Z3490 Encounter for supervision of normal pregnancy, unspecified, unspecified trimester: Secondary | ICD-10-CM

## 2022-05-16 DIAGNOSIS — R109 Unspecified abdominal pain: Secondary | ICD-10-CM | POA: Diagnosis not present

## 2022-05-16 DIAGNOSIS — M545 Low back pain, unspecified: Secondary | ICD-10-CM | POA: Diagnosis not present

## 2022-05-16 HISTORY — DX: Polycystic ovarian syndrome: E28.2

## 2022-05-16 LAB — GC/CHLAMYDIA PROBE AMP (~~LOC~~) NOT AT ARMC
Chlamydia: NEGATIVE
Comment: NEGATIVE
Comment: NORMAL
Neisseria Gonorrhea: NEGATIVE

## 2022-05-16 LAB — URINALYSIS, ROUTINE W REFLEX MICROSCOPIC
Bilirubin Urine: NEGATIVE
Glucose, UA: NEGATIVE mg/dL
Hgb urine dipstick: NEGATIVE
Ketones, ur: NEGATIVE mg/dL
Leukocytes,Ua: NEGATIVE
Nitrite: NEGATIVE
Protein, ur: NEGATIVE mg/dL
Specific Gravity, Urine: 1.019 (ref 1.005–1.030)
pH: 6 (ref 5.0–8.0)

## 2022-05-16 LAB — HCG, QUANTITATIVE, PREGNANCY: hCG, Beta Chain, Quant, S: 1042 m[IU]/mL — ABNORMAL HIGH (ref <5)

## 2022-05-16 NOTE — Discharge Instructions (Signed)

## 2022-05-16 NOTE — MAU Provider Note (Signed)
  History     CSN: [id number]  Arrival date and time: 05/16/22 1703   Event Date/Time   First Provider Initiated Contact with Patient 05/16/22 2112      Chief Complaint  Patient presents with   Abdominal Pain   HPI Elizabeth Wheeler is a 22 y.o. G3P0020 at [redacted]w[redacted]d who presents to MAU with chief complaint of worsening abdominal pain and new onset low back pain.  Patient receives care with [facility] and is scheduled to be seen in the office on Friday 05/23/2022 {GYN/OB [id number]}  Past Medical History:  Diagnosis Date   Cysts of both ovaries    PCOS (polycystic ovarian syndrome)     History reviewed. No pertinent surgical history.  History reviewed. No pertinent family history.  Social History   Tobacco Use   Smoking status: Former    Types: Cigarettes    Quit date: 02/01/2021    Years since quitting: 1.2   Smokeless tobacco: Never  Vaping Use   Vaping Use: Never used  Substance Use Topics   Alcohol use: Not Currently    Comment: occ   Drug use: Never    Allergies:  Allergies  Allergen Reactions   Amoxicillin Other (See Comments)    Yeast infection    Medications Prior to Admission  Medication Sig Dispense Refill Last Dose   Prenatal Vit-Fe Fumarate-FA (PRENATAL COMPLETE) 14-0.4 MG TABS Take 1 tablet by mouth daily. 60 tablet 3 05/16/2022   vitamin B-6 (PYRIDOXINE) 25 MG tablet Take 1 tablet (25 mg total) by mouth every 6 (six) hours as needed. 14 tablet 1 05/16/2022   cephALEXin (KEFLEX) 500 MG capsule Take 1 capsule (500 mg total) by mouth 3 (three) times daily. 21 capsule 0    Doxylamine-Pyridoxine 10-10 MG TBEC Take 2 tablets by mouth at bedtime. 60 tablet 0    ibuprofen (ADVIL) 800 MG tablet Take 1 tablet (800 mg total) by mouth 3 (three) times daily. 21 tablet 0    meloxicam (MOBIC) 7.5 MG tablet Take 1 tablet (7.5 mg total) by mouth daily. (Patient not taking: Reported on 03/05/2021) 30 tablet 0    ondansetron (ZOFRAN ODT) 4 MG disintegrating tablet Take 1 tablet  (4 mg total) by mouth every 8 (eight) hours as needed for nausea or vomiting. 20 tablet 0    ondansetron (ZOFRAN-ODT) 4 MG disintegrating tablet Take 1 tablet (4 mg total) by mouth every 4 (four) hours as needed for nausea or vomiting. 20 tablet 0    promethazine (PHENERGAN) 25 MG tablet Take 1 tablet (25 mg total) by mouth every 6 (six) hours as needed for nausea or vomiting. 30 tablet 0     Review of Systems Physical Exam   Blood pressure 112/71, pulse 73, temperature 98.7 F (37.1 C), temperature source Oral, resp. rate 17, height 5\' 4"  (1.626 m), weight 49.4 kg, last menstrual period 04/10/2022, SpO2 99 %, unknown if currently breastfeeding.  Physical Exam  MAU Course  Procedures  MDM ***  Assessment and Plan  ***  Darlina Rumpf 05/16/2022, 9:17 PM

## 2022-05-16 NOTE — MAU Note (Signed)
.  Elizabeth Wheeler is a 22 y.o. at [redacted]w[redacted]d here in MAU reporting: Lower abdominal cramping (5/10) that started about an hour ago. She reports being seen in MAU yesterday for the same pain and was discharged with follow-up instructions for repeat hCG. Denies bleeding or abnormal discharge.  LMP: 04/10/2022 Onset of complaint: Today Pain score: 5/10 Vitals:   05/16/22 1724  BP: 112/71  Pulse: 73  Resp: 17  Temp: 98.7 F (37.1 C)  SpO2: 99%     FHT: Not indicated Lab orders placed from triage:  UA

## 2022-05-17 NOTE — MAU Provider Note (Incomplete)
History     CSN: [id number]  Arrival date and time: 05/16/22 1703   Event Date/Time   First Provider Initiated Contact with Patient 05/16/22 2112      Chief Complaint  Patient presents with  . Abdominal Pain   HPI Elizabeth Wheeler is a 22 y.o. G3P0020 at [redacted]w[redacted]d who presents to MAU with chief complaint of worsening abdominal pain and new onset low back pain. She was evaluated in MAU for abdominal pain yesterday but states her abdominal pain is significantly worse. Her low back pain is new as of today. Pain score is 4/10. She is not experiencing vaginal bleeding, abdominal tenderness, dysuria, fever or recent illness. OB hx is significant for SAB x 2.   Patient receives care with [facility] and is scheduled to be seen in the office on Friday 05/23/2022  OB History     Gravida  3   Para      Term      Preterm      AB  2   Living         SAB  2   IAB      Ectopic      Multiple      Live Births              Past Medical History:  Diagnosis Date  . Cysts of both ovaries   . PCOS (polycystic ovarian syndrome)     History reviewed. No pertinent surgical history.  History reviewed. No pertinent family history.  Social History   Tobacco Use  . Smoking status: Former    Types: Cigarettes    Quit date: 02/01/2021    Years since quitting: 1.2  . Smokeless tobacco: Never  Vaping Use  . Vaping Use: Never used  Substance Use Topics  . Alcohol use: Not Currently    Comment: occ  . Drug use: Never    Allergies:  Allergies  Allergen Reactions  . Amoxicillin Other (See Comments)    Yeast infection    Medications Prior to Admission  Medication Sig Dispense Refill Last Dose  . Prenatal Vit-Fe Fumarate-FA (PRENATAL COMPLETE) 14-0.4 MG TABS Take 1 tablet by mouth daily. 60 tablet 3 05/16/2022  . vitamin B-6 (PYRIDOXINE) 25 MG tablet Take 1 tablet (25 mg total) by mouth every 6 (six) hours as needed. 14 tablet 1 05/16/2022  . cephALEXin (KEFLEX) 500 MG capsule Take  1 capsule (500 mg total) by mouth 3 (three) times daily. 21 capsule 0   . Doxylamine-Pyridoxine 10-10 MG TBEC Take 2 tablets by mouth at bedtime. 60 tablet 0   . ibuprofen (ADVIL) 800 MG tablet Take 1 tablet (800 mg total) by mouth 3 (three) times daily. 21 tablet 0   . meloxicam (MOBIC) 7.5 MG tablet Take 1 tablet (7.5 mg total) by mouth daily. (Patient not taking: Reported on 03/05/2021) 30 tablet 0   . ondansetron (ZOFRAN ODT) 4 MG disintegrating tablet Take 1 tablet (4 mg total) by mouth every 8 (eight) hours as needed for nausea or vomiting. 20 tablet 0   . ondansetron (ZOFRAN-ODT) 4 MG disintegrating tablet Take 1 tablet (4 mg total) by mouth every 4 (four) hours as needed for nausea or vomiting. 20 tablet 0   . promethazine (PHENERGAN) 25 MG tablet Take 1 tablet (25 mg total) by mouth every 6 (six) hours as needed for nausea or vomiting. 30 tablet 0     Review of Systems Physical Exam   Blood pressure 112/71, pulse 73, temperature  98.7 F (37.1 C), temperature source Oral, resp. rate 17, height 5\' 4"  (1.626 m), weight 49.4 kg, last menstrual period 04/10/2022, SpO2 99 %, unknown if currently breastfeeding.  Physical Exam  MAU Course  Procedures  MDM ***  Assessment and Plan  ***  Darlina Rumpf 05/16/2022, 9:17 PM

## 2022-05-19 ENCOUNTER — Other Ambulatory Visit: Payer: Self-pay

## 2022-05-19 ENCOUNTER — Inpatient Hospital Stay (HOSPITAL_COMMUNITY)
Admission: AD | Admit: 2022-05-19 | Discharge: 2022-05-19 | Disposition: A | Discharge status: Home / Self Care | Payer: Medicaid Other | Attending: Obstetrics and Gynecology | Admitting: Obstetrics and Gynecology

## 2022-05-19 ENCOUNTER — Inpatient Hospital Stay (HOSPITAL_COMMUNITY): Payer: Medicaid Other

## 2022-05-19 DIAGNOSIS — O26891 Other specified pregnancy related conditions, first trimester: Secondary | ICD-10-CM

## 2022-05-19 DIAGNOSIS — Z87891 Personal history of nicotine dependence: Secondary | ICD-10-CM | POA: Insufficient documentation

## 2022-05-19 DIAGNOSIS — Z3A01 Less than 8 weeks gestation of pregnancy: Secondary | ICD-10-CM | POA: Diagnosis not present

## 2022-05-19 DIAGNOSIS — R109 Unspecified abdominal pain: Secondary | ICD-10-CM | POA: Diagnosis not present

## 2022-05-19 DIAGNOSIS — Z711 Person with feared health complaint in whom no diagnosis is made: Secondary | ICD-10-CM

## 2022-05-19 LAB — URINALYSIS, ROUTINE W REFLEX MICROSCOPIC
Bilirubin Urine: NEGATIVE
Glucose, UA: NEGATIVE mg/dL
Hgb urine dipstick: NEGATIVE
Ketones, ur: NEGATIVE mg/dL
Leukocytes,Ua: NEGATIVE
Nitrite: NEGATIVE
Protein, ur: NEGATIVE mg/dL
Specific Gravity, Urine: 1.003 — ABNORMAL LOW (ref 1.005–1.030)
pH: 6 (ref 5.0–8.0)

## 2022-05-19 LAB — HCG, QUANTITATIVE, PREGNANCY: hCG, Beta Chain, Quant, S: 4175 m[IU]/mL — ABNORMAL HIGH (ref <5)

## 2022-05-19 MED ORDER — ACETAMINOPHEN 500 MG PO TABS
1000.0000 mg | ORAL_TABLET | Freq: Once | ORAL | Status: AC
  Administered 2022-05-19: 1000 mg via ORAL
  Filled 2022-05-19: qty 2

## 2022-05-19 NOTE — MAU Provider Note (Signed)
History     CSN: [id number]  Arrival date and time: 05/19/22 2048   Event Date/Time   First Provider Initiated Contact with Patient 05/19/22 2256      Chief Complaint  Patient presents with   Abdominal Pain   Elizabeth Wheeler , a  22 y.o. G3P0020 at [redacted]w[redacted]d presents to MAU with complaints of abdominal pain that started about 1900 this evening. Patient describes pain as "cramping" and intermittent. She is currently rating pain a 5/10. She denies vaginal bleeding or abnormal vaginal discharge. She denies recent intercourse. Patient states she has had 2 SAB ~7 weeks and she really desires this pregnancy and is very worried. She was recently seen in MAU for similar complaints.  She has no other complaints.          OB History     Gravida  3   Para      Term      Preterm      AB  2   Living         SAB  2   IAB      Ectopic      Multiple      Live Births              Past Medical History:  Diagnosis Date   Cysts of both ovaries    PCOS (polycystic ovarian syndrome)     No past surgical history on file.  No family history on file.  Social History   Tobacco Use   Smoking status: Former    Types: Cigarettes    Quit date: 02/01/2021    Years since quitting: 1.2   Smokeless tobacco: Never  Vaping Use   Vaping Use: Never used  Substance Use Topics   Alcohol use: Not Currently    Comment: occ   Drug use: Never    Allergies:  Allergies  Allergen Reactions   Amoxicillin Other (See Comments)    Yeast infection    Medications Prior to Admission  Medication Sig Dispense Refill Last Dose   Prenatal Vit-Fe Fumarate-FA (PRENATAL COMPLETE) 14-0.4 MG TABS Take 1 tablet by mouth daily. 60 tablet 3    vitamin B-6 (PYRIDOXINE) 25 MG tablet Take 1 tablet (25 mg total) by mouth every 6 (six) hours as needed. 14 tablet 1     Review of Systems  Constitutional:  Negative for chills, fatigue and fever.  Eyes:  Negative for pain and visual disturbance.   Respiratory:  Negative for apnea, shortness of breath and wheezing.   Cardiovascular:  Negative for chest pain and palpitations.  Gastrointestinal:  Positive for abdominal pain. Negative for constipation, diarrhea, nausea and vomiting.  Genitourinary:  Negative for difficulty urinating, dysuria, pelvic pain, vaginal bleeding, vaginal discharge and vaginal pain.  Musculoskeletal:  Negative for back pain.  Neurological:  Negative for seizures, weakness and headaches.  Psychiatric/Behavioral:  Negative for suicidal ideas.    Physical Exam   Blood pressure 115/75, pulse 86, temperature 98.4 F (36.9 C), temperature source Oral, resp. rate 17, height 5\' 4"  (1.626 m), weight 49.7 kg, last menstrual period 04/10/2022, SpO2 100 %, unknown if currently breastfeeding.  Physical Exam Vitals and nursing note reviewed. Exam conducted with a chaperone present.  Constitutional:      General: She is not in acute distress.    Appearance: Normal appearance.  HENT:     Head: Normocephalic.  Pulmonary:     Effort: Pulmonary effort is normal.  Musculoskeletal:     Cervical  back: Normal range of motion.  Skin:    General: Skin is warm and dry.     Capillary Refill: Capillary refill takes less than 2 seconds.  Neurological:     Mental Status: She is alert and oriented to person, place, and time.  Psychiatric:        Mood and Affect: Mood normal.     MAU Course  Procedures Orders Placed This Encounter  Procedures   US OB Transvaginal   Urinalysis, Routine w reflex microscopic Urine, Clean Catch   hCG, quantitative, pregnancy   Discharge patient   Meds ordered this encounter  Medications   acetaminophen (TYLENOL) tablet 1,000 mg   Results for orders placed or performed during the hospital encounter of 05/19/22 (from the past 24 hour(s))  Urinalysis, Routine w reflex microscopic Urine, Clean Catch     Status: Abnormal   Collection Time: 05/19/22  9:14 PM  Result Value Ref Range   Color,  Urine YELLOW YELLOW   APPearance CLOUDY (A) CLEAR   Specific Gravity, Urine 1.003 (L) 1.005 - 1.030   pH 6.0 5.0 - 8.0   Glucose, UA NEGATIVE NEGATIVE mg/dL   Hgb urine dipstick NEGATIVE NEGATIVE   Bilirubin Urine NEGATIVE NEGATIVE   Ketones, ur NEGATIVE NEGATIVE mg/dL   Protein, ur NEGATIVE NEGATIVE mg/dL   Nitrite NEGATIVE NEGATIVE   Leukocytes,Ua NEGATIVE NEGATIVE  hCG, quantitative, pregnancy     Status: Abnormal   Collection Time: 05/19/22  9:46 PM  Result Value Ref Range   hCG, Beta Chain, Quant, S 4,175 (H) <5 mIU/mL   US OB Transvaginal  Result Date: 05/19/2022 CLINICAL DATA:  Pelvic pain and cramping EXAM: TRANSVAGINAL OB ULTRASOUND TECHNIQUE: Transvaginal ultrasound was performed for complete evaluation of the gestation as well as the maternal uterus, adnexal regions, and pelvic cul-de-sac. COMPARISON:  05/16/2022 FINDINGS: Intrauterine gestational sac: Present Yolk sac:  Present Embryo:  Absent MSD: 5.6 mm mm   5 w   2 d Subchorionic hemorrhage: Small subchorionic hemorrhage is noted measuring up to 1.1 cm. Maternal uterus/adnexae: Ovaries are within normal limits. IMPRESSION: Early intrauterine gestational sac and yolk sac but no fetal pole, or cardiac activity yet visualized. Recommend follow-up quantitative B-HCG levels and follow-up US in 14 days to assess viability. This recommendation follows SRU consensus guidelines: Diagnostic Criteria for Nonviable Pregnancy Early in the First Trimester. [employer] Med 2013; 275:1700-17. New subchorionic hemorrhage is noted measuring up to 1.1 cm. Electronically Signed   By: Inez Catalina M.D.   On: 05/19/2022 22:18     MDM - Continued appropriate rise in Quant  - Pain relieved with PO Tylenol  - Urine cloudy and SG 1.003 but otherwise normal.  -  [place] results revealed a IUP with a GS and YS. Similar findings from previous US.  - Plan for discharge   Assessment and Plan   1. [redacted] weeks gestation of pregnancy   2. Abdominal pain, unspecified  abdominal location   3. Physically well but worried    - Reviewed findings with patient. Discussed normal findings for this gestation of pregnancy.  - Reviewed worsening signs and return precautions - Patient has NOB scheduled with [facility] for this Friday. Encouraged to keep appointment.  - Patient discharged home in stable condition and may return to MAU as needed.   Jacquiline Doe, MSN CNM  05/19/2022, 10:56 PM

## 2022-05-19 NOTE — MAU Note (Signed)
.  Elizabeth Wheeler is a 22 y.o. at [redacted]w[redacted]d here in MAU reporting: was seen twice last week for same thing - reports lower abdominal cramping and back pain. States pain started about an hour ago and has been constant. Worried about having a miscarriage. Denies VB or abnormal discharge.   Onset of complaint: 2000 Pain score: 5 Vitals:   05/19/22 2111  BP: 115/75  Pulse: 86  Resp: 17  Temp: 98.4 F (36.9 C)  SpO2: 100%     FHT:NA  Lab orders placed from triage:  UA

## 2022-05-24 ENCOUNTER — Encounter (HOSPITAL_COMMUNITY): Payer: Self-pay | Admitting: Obstetrics and Gynecology

## 2022-05-24 ENCOUNTER — Inpatient Hospital Stay (HOSPITAL_COMMUNITY)
Admission: AD | Admit: 2022-05-24 | Discharge: 2022-05-24 | Disposition: A | Discharge status: Home / Self Care | Payer: Medicaid Other | Attending: Obstetrics and Gynecology | Admitting: Obstetrics and Gynecology

## 2022-05-24 DIAGNOSIS — R109 Unspecified abdominal pain: Secondary | ICD-10-CM | POA: Diagnosis not present

## 2022-05-24 DIAGNOSIS — Z3A01 Less than 8 weeks gestation of pregnancy: Secondary | ICD-10-CM | POA: Insufficient documentation

## 2022-05-24 DIAGNOSIS — M25519 Pain in unspecified shoulder: Secondary | ICD-10-CM | POA: Diagnosis not present

## 2022-05-24 DIAGNOSIS — Z711 Person with feared health complaint in whom no diagnosis is made: Secondary | ICD-10-CM

## 2022-05-24 DIAGNOSIS — M549 Dorsalgia, unspecified: Secondary | ICD-10-CM | POA: Insufficient documentation

## 2022-05-24 DIAGNOSIS — E559 Vitamin D deficiency, unspecified: Secondary | ICD-10-CM | POA: Diagnosis not present

## 2022-05-24 DIAGNOSIS — O09291 Supervision of pregnancy with other poor reproductive or obstetric history, first trimester: Secondary | ICD-10-CM | POA: Diagnosis not present

## 2022-05-24 DIAGNOSIS — O26891 Other specified pregnancy related conditions, first trimester: Secondary | ICD-10-CM | POA: Diagnosis not present

## 2022-05-24 DIAGNOSIS — O99281 Endocrine, nutritional and metabolic diseases complicating pregnancy, first trimester: Secondary | ICD-10-CM | POA: Insufficient documentation

## 2022-05-24 NOTE — MAU Provider Note (Signed)
History     [id number]  Arrival date and time: 05/24/22 0802    Chief Complaint  Patient presents with   Back Pain   Abdominal Pain   Vitamin D Deficiency     HPI Elizabeth Wheeler is a 22 y.o. at [redacted]w[redacted]d who presents for vitamin D deficiency. Patient saw her lab results in her patient portal this morning from yesterday's visit.  Saw that she was deficient in vitamin D which was a problem with her in the past.  Concerned due to previous 2 miscarriages that the deficiency will affect the outcome of this pregnancy.  Currently taking vitamin D supplement, 2000 units/day.  Called the on-call nurse and was told due to shoulder pain that she should come to MAU, presumably to rule out ectopic pregnancy.  Patient reports in office yesterday that she had an ultrasound that showed a gestational sac with a yolk sac.  She has follow-up ultrasound on February 2.  Reports ongoing intermittent abdominal cramping & shoulder/back pain that has not changed since previous visit.  Denies vaginal bleeding.  OB History     Gravida  3   Para      Term      Preterm      AB  2   Living         SAB  2   IAB      Ectopic      Multiple      Live Births              Past Medical History:  Diagnosis Date   Cysts of both ovaries    PCOS (polycystic ovarian syndrome)     Past Surgical History:  Procedure Laterality Date   NO PAST SURGERIES      No family history on file.  Allergies  Allergen Reactions   Amoxicillin Other (See Comments)    Yeast infection    No current facility-administered medications on file prior to encounter.   Current Outpatient Medications on File Prior to Encounter  Medication Sig Dispense Refill   progesterone (PROMETRIUM) 200 MG capsule SMARTSIG:2 Capsule(s) Vaginal Twice Daily     Prenatal Vit-Fe Fumarate-FA (PRENATAL COMPLETE) 14-0.4 MG TABS Take 1 tablet by mouth daily. 60 tablet 3   vitamin B-6 (PYRIDOXINE) 25 MG tablet Take 1 tablet (25 mg total)  by mouth every 6 (six) hours as needed. 14 tablet 1     ROS Pertinent positives and negative per HPI, all others reviewed and negative  Physical Exam   BP 126/69 (BP Location: Right Arm)   Pulse (!) 107   Temp 98.5 F (36.9 C) (Oral)   Resp 15   Ht 5\' 4"  (1.626 m)   Wt 49 kg   LMP 04/10/2022   SpO2 99%   BMI 18.54 kg/m   Patient Vitals for the past 24 hrs:  BP Temp Temp src Pulse Resp SpO2 Height Weight  05/24/22 0817 126/69 98.5 F (36.9 C) Oral (!) 107 15 99 % 5\' 4"  (1.626 m) 49 kg    Physical Exam Vitals and nursing note reviewed.  Constitutional:      General: She is not in acute distress.    Appearance: She is well-developed. She is not ill-appearing.  Pulmonary:     Effort: Pulmonary effort is normal. No respiratory distress.  Neurological:     General: No focal deficit present.     Mental Status: She is alert.  Psychiatric:        Mood and  Affect: Mood is anxious.        Speech: Speech normal.        Behavior: Behavior normal.       Labs No results found for this or any previous visit (from the past 24 hour(s)).  Imaging No results found.  MAU Course  Procedures Lab Orders  No laboratory test(s) ordered today   No orders of the defined types were placed in this encounter.  Imaging Orders  No imaging studies ordered today    MDM Ectopic pregnancy previously ruled out with ultrasound showing IUGS with yolk sac. Patient has no new symptoms & has appropriate follow up to assess pregnancy viability.   Reviewed vitamin D results on patient's computer which shows level at 24.7. Reassured patient that goal is over 20.   Assessment and Plan   1. Physically well but worried   2. History of miscarriage, currently pregnant, first trimester   3. [redacted] weeks gestation of pregnancy    -Encouraged patient to return for any new symptoms or concerns for miscarriage including worsening pain or vaginal bleeding -Keep scheduled follow up with OB   Jorje Guild, NP 05/24/22 10:27 AM

## 2022-05-24 NOTE — MAU Note (Addendum)
...  Elizabeth Wheeler is a 22 y.o. at [redacted]w[redacted]d here in MAU reporting: She reports she received her blood work from [facility] yesterday for her vitamin D levels and reports they were "dangerously low." She reports she called her on call OB nurse to see what she should do and the nurse began asking questions about her pregnancy and the pains she is experiencing. She reports the on call nurse asked if she was experiencing any shoulder pain accompanied with her abdominal pain and the patient told her she was. She reports the on call nurse told her she should come to MAU immediately. Denies VB. Denies current pain. Has been experiencing intermittent lower abdominal cramping, left shoulder discomfort, and lower back pain for the entirety of this pregnancy. She reports she last felt these pains around an hour ago.   Regarding her vitamin D levels, she reports she is unsure of what she needs to do to increase those. She reports she is approaching 7 weeks of pregnancy, and in the past, she has miscarried twice around 7 weeks. She is fearful her low vitamin D levels are going to cause her to have another miscarriage. She reports she is taking progesterone daily as well as 2000 UI of Vitamin D.   Next OB appointment with Ultrasound on 2/2. U/S in office yesterday showed a MSD of [redacted]w[redacted]d with a yolk sac. No fetal pole yet per patient.  Onset of complaint: Ongoing Pain score: Denies current pain

## 2022-05-24 NOTE — Discharge Instructions (Signed)
Return to care  If you have heavier bleeding that soaks through more than 2 pads per hour for an hour or more If you bleed so much that you feel like you might pass out or you do pass out If you have significant abdominal pain that is not improved with Tylenol   

## 2022-05-26 ENCOUNTER — Other Ambulatory Visit: Payer: Self-pay

## 2022-05-26 ENCOUNTER — Inpatient Hospital Stay (HOSPITAL_COMMUNITY)
Admission: AD | Admit: 2022-05-26 | Discharge: 2022-05-26 | Disposition: A | Discharge status: Home / Self Care | Payer: Medicaid Other | Attending: Obstetrics and Gynecology | Admitting: Obstetrics and Gynecology

## 2022-05-26 ENCOUNTER — Inpatient Hospital Stay (HOSPITAL_COMMUNITY): Payer: Medicaid Other

## 2022-05-26 DIAGNOSIS — O26891 Other specified pregnancy related conditions, first trimester: Secondary | ICD-10-CM | POA: Diagnosis not present

## 2022-05-26 DIAGNOSIS — M549 Dorsalgia, unspecified: Secondary | ICD-10-CM | POA: Diagnosis not present

## 2022-05-26 DIAGNOSIS — Z3A01 Less than 8 weeks gestation of pregnancy: Secondary | ICD-10-CM | POA: Diagnosis not present

## 2022-05-26 DIAGNOSIS — R109 Unspecified abdominal pain: Secondary | ICD-10-CM | POA: Diagnosis present

## 2022-05-26 DIAGNOSIS — Z3490 Encounter for supervision of normal pregnancy, unspecified, unspecified trimester: Secondary | ICD-10-CM

## 2022-05-26 LAB — URINALYSIS, ROUTINE W REFLEX MICROSCOPIC
Bilirubin Urine: NEGATIVE
Glucose, UA: NEGATIVE mg/dL
Hgb urine dipstick: NEGATIVE
Ketones, ur: NEGATIVE mg/dL
Leukocytes,Ua: NEGATIVE
Nitrite: NEGATIVE
Protein, ur: NEGATIVE mg/dL
Specific Gravity, Urine: 1.011 (ref 1.005–1.030)
pH: 6 (ref 5.0–8.0)

## 2022-05-26 NOTE — MAU Provider Note (Signed)
History     CSN: [id number]  Arrival date and time: 05/26/22 1250    Chief Complaint  Patient presents with   Cramping   Back Pain   HPI This is a 22 year old G3, P0 at 6 weeks and 4 days by LMP Who presents with pelvic cramping that started last week and is continued.  Has not improved at all.  She was evaluated in the MAU and had a ultrasound that showed an empty gestational sac.  She has no other complaints. Denies bleeding, vision changes.   OB History     Gravida  3   Para      Term      Preterm      AB  2   Living         SAB  2   IAB      Ectopic      Multiple      Live Births              Past Medical History:  Diagnosis Date   Cysts of both ovaries    PCOS (polycystic ovarian syndrome)     Past Surgical History:  Procedure Laterality Date   NO PAST SURGERIES      No family history on file.  Social History   Tobacco Use   Smoking status: Former    Types: Cigarettes    Quit date: 02/01/2021    Years since quitting: 1.3   Smokeless tobacco: Never  Vaping Use   Vaping Use: Never used  Substance Use Topics   Alcohol use: Not Currently    Comment: occ   Drug use: Never    Allergies:  Allergies  Allergen Reactions   Amoxicillin Other (See Comments)    Yeast infection    Medications Prior to Admission  Medication Sig Dispense Refill Last Dose   Prenatal Vit-Fe Fumarate-FA (PRENATAL COMPLETE) 14-0.4 MG TABS Take 1 tablet by mouth daily. 60 tablet 3    progesterone (PROMETRIUM) 200 MG capsule SMARTSIG:2 Capsule(s) Vaginal Twice Daily      vitamin B-6 (PYRIDOXINE) 25 MG tablet Take 1 tablet (25 mg total) by mouth every 6 (six) hours as needed. 14 tablet 1     Review of Systems Physical Exam   Blood pressure 121/75, pulse 84, temperature 98.5 F (36.9 C), temperature source Oral, resp. rate 19, height 5' 4.75" (1.645 m), weight 49.4 kg, last menstrual period 04/10/2022, SpO2 100 %, unknown if currently  breastfeeding.  Physical Exam Vitals and nursing note reviewed.  Constitutional:      Appearance: Normal appearance.  Cardiovascular:     Rate and Rhythm: Normal rate.  Pulmonary:     Effort: Pulmonary effort is normal.  Abdominal:     General: Abdomen is flat.     Palpations: Abdomen is soft.     Tenderness: There is abdominal tenderness (lower pelvic tenderness).  Neurological:     Mental Status: She is alert.  Psychiatric:        Mood and Affect: Mood normal.        Behavior: Behavior normal.        Thought Content: Thought content normal.        Judgment: Judgment normal.    Results for orders placed or performed during the hospital encounter of 05/26/22 (from the past 24 hour(s))  Urinalysis, Routine w reflex microscopic Urine, Clean Catch     Status: Abnormal   Collection Time: 05/26/22  2:29 PM  Result Value Ref Range  Color, Urine YELLOW YELLOW   APPearance HAZY (A) CLEAR   Specific Gravity, Urine 1.011 1.005 - 1.030   pH 6.0 5.0 - 8.0   Glucose, UA NEGATIVE NEGATIVE mg/dL   Hgb urine dipstick NEGATIVE NEGATIVE   Bilirubin Urine NEGATIVE NEGATIVE   Ketones, ur NEGATIVE NEGATIVE mg/dL   Protein, ur NEGATIVE NEGATIVE mg/dL   Nitrite NEGATIVE NEGATIVE   Leukocytes,Ua NEGATIVE NEGATIVE   US OB LESS THAN 14 WEEKS WITH OB TRANSVAGINAL  Result Date: 05/26/2022 CLINICAL DATA:  [id number] Abdominal pain affecting pregnancy [id number] EXAM: OBSTETRIC <14 WK [place] AND TRANSVAGINAL OB US TECHNIQUE: Ob ultrasound was performed for complete evaluation of the gestation as well as the maternal uterus, adnexal regions, and pelvic cul-de-sac. COMPARISON:  None Available. FINDINGS: Intrauterine gestational sac: Single Yolk sac:  Visualized. Embryo:  Visualized. Cardiac Activity: Visualized. Heart Rate: 94 bpm CRL:   0.2 cm = 5 weeks 5 days [place] EDC: 01/21/2023 Subchorionic hemorrhage:  None visualized. Adnexa: No masses or fluid collections. 1.8 cm corpus luteum on the right. IMPRESSION: Single  viable intrauterine pregnancy with fetal heart motion measuring 5 weeks 5 days with ultrasound [facility] 01/21/2023. No adnexal pathology identified Electronically Signed   By: Sammie Bench M.D.   On: 05/26/2022 16:18     MAU Course  Procedures  MDM  Assessment and Plan   1. Intrauterine pregnancy    IUP seen on [place]. Miscarriage si/sxs known to patient. Discharge to home with followup in office.  Truett Mainland 05/26/2022, 2:33 PM

## 2022-05-26 NOTE — MAU Note (Signed)
Elizabeth Wheeler is a 22 y.o. at [redacted]w[redacted]d here in MAU reporting: she was evaluated 2 days ago for cramping and back pain, states both back pain and cramping have continued and wants further evaluation.  Denies VB. LMP: NA Onset of complaint: 2 days ago Pain score: 6 Vitals:   05/26/22 1401  BP: 121/75  Pulse: 84  Resp: 19  Temp: 98.5 F (36.9 C)  SpO2: 100%     FHT:NA Lab orders placed from triage:   UA

## 2022-05-26 NOTE — Discharge Instructions (Signed)
[place] for [employer] at [employer] [facility]             [address] [place], [place] [address] [place] for [facility] at [place], [place], [place], [place], [address] [phone]  Center for [facility] at [facility], [place], [place], [place], [address] [phone]  Center for [facility] at [facility] [address] [place], [place], [place], [address] [phone]  Center for [facility] at [facility]                                 [place], [place], [place], [address] [phone]  Center for [facility]                                    [address] [place], [place], [address] [place] for [place] at [facility] [address], [place], [place], [place], [address]                              [facility] [place], [place], [place], [place], [address] [phone]  [facility]         Phone: [phone]  [facility]      Phone: [phone]   [facility]      Phone: [place] [facility]         Phone: [phone]   [place] [facility]    Phone: [place]      Phone: [phone]  [facility] [place]     Phone: [phone]  [facility]        Phone: [phone]  [facility]) [phone]  [facility]      Phone: [phone]

## 2022-05-28 ENCOUNTER — Encounter (HOSPITAL_COMMUNITY): Payer: Self-pay | Admitting: Obstetrics and Gynecology

## 2022-05-28 ENCOUNTER — Inpatient Hospital Stay (HOSPITAL_COMMUNITY)
Admission: AD | Admit: 2022-05-28 | Discharge: 2022-05-28 | Disposition: A | Discharge status: Home / Self Care | Payer: Medicaid Other | Attending: Obstetrics and Gynecology | Admitting: Obstetrics and Gynecology

## 2022-05-28 DIAGNOSIS — O99341 Other mental disorders complicating pregnancy, first trimester: Secondary | ICD-10-CM | POA: Diagnosis not present

## 2022-05-28 DIAGNOSIS — R109 Unspecified abdominal pain: Secondary | ICD-10-CM | POA: Insufficient documentation

## 2022-05-28 DIAGNOSIS — Z3A01 Less than 8 weeks gestation of pregnancy: Secondary | ICD-10-CM | POA: Diagnosis not present

## 2022-05-28 DIAGNOSIS — O26891 Other specified pregnancy related conditions, first trimester: Secondary | ICD-10-CM | POA: Diagnosis not present

## 2022-05-28 DIAGNOSIS — F32A Depression, unspecified: Secondary | ICD-10-CM | POA: Insufficient documentation

## 2022-05-28 DIAGNOSIS — M549 Dorsalgia, unspecified: Secondary | ICD-10-CM | POA: Diagnosis not present

## 2022-05-28 DIAGNOSIS — O26899 Other specified pregnancy related conditions, unspecified trimester: Secondary | ICD-10-CM

## 2022-05-28 HISTORY — DX: Depression, unspecified: F32.A

## 2022-05-28 HISTORY — DX: Urinary tract infection, site not specified: N39.0

## 2022-05-28 NOTE — MAU Note (Signed)
Elizabeth Wheeler is a 22 y.o. at [redacted]w[redacted]d here in MAU reporting: less 2 days have been very very stressful. Is a full time student.  Trying to advocate for herself.  Has been yelling a lot, is afraid she may have hurt the baby.  Has been having pain on LLQ, cramping has seemed worse.  Also having some pain in lower back on rt side. Hx of 2 prior miscarriages.   Onset of complaint: ongoing Pain score: moderate Vitals:   05/28/22 1033  BP: 116/73  Pulse: 89  Resp: 20  Temp: 98.7 F (37.1 C)  SpO2: 100%      Lab orders placed from triage:

## 2022-05-28 NOTE — MAU Provider Note (Addendum)
Event Date/Time   First Provider Initiated Contact with Patient 05/28/22 1102      S [name] is a 22 y.o. G3P0020 patient who presents to MAU today with complaint of being "very stressed at school the past 2 days." She reports she has one professor that is "out to get fail her". She reports she is "trying to advocate for myself and no one at school is helping." She reports the professor "tried to call me out in the middle of class today by displaying my grade up on the screen. I then started yelling at him." She reports since she started yelling, her cramping increased. She has a h/o of 2 SABs and is "afraid (she) has hurt the baby." She has been to MAU several times in the past week; last time was 05/26/2022. At that MAU visit, she had a documented 5.5 wks living IUP. She denies VB or abnormal vaginal discharge. She receives [facility] with [facility]; next appt is 06/06/2022.   O BP 116/73 (BP Location: Right Arm)   Pulse 89   Temp 98.7 F (37.1 C) (Oral)   Resp 20   Ht 5' 4.75" (1.645 m)   Wt 49.5 kg   LMP 04/10/2022   SpO2 100%   BMI 18.31 kg/m   Physical Exam Vitals and nursing note reviewed.  Constitutional:      Appearance: Normal appearance. She is normal weight.  Cardiovascular:     Rate and Rhythm: Normal rate.  Pulmonary:     Effort: Pulmonary effort is normal.  Neurological:     Mental Status: She is alert and oriented to person, place, and time.  Psychiatric:        Attention and Perception: Attention and perception normal.        Mood and Affect: Mood is anxious and depressed. Affect is tearful.        Speech: Speech normal.        Behavior: Behavior normal.        Thought Content: Thought content normal.        Cognition and Memory: Cognition and memory normal.        Judgment: Judgment normal.    A Medical screening exam complete 1. Cramping affecting pregnancy, antepartum  2. Back pain affecting pregnancy in first trimester  3. Depression affecting  pregnancy in first trimester, antepartum  4. [redacted] weeks gestation of pregnancy   P Discharge from MAU in stable condition Patient advised to go home, eat small frequent meals every 2-3 hours, stay well-hydrated, increase rest for the rest of today.  Advised to take Tylenol 1000 mg po every 8 hours prn cramping and back pain Reassurance given that baby is not harmed from her stressful days at school Advised that she discuss with her provider at [facility] about starting on SSRI and mental health referral Warning signs for worsening condition that would warrant emergency follow-up discussed Patient may return to MAU as needed for pregnancy related issues Patient verbalized an understanding of the plan of care and agrees.   Total face-to-face time spent during this encounter was 15 minutes. There was 5 minutes of chart review time spent prior to this encounter. Total time spent = 20 minutes.   Laury Deep, CNM 05/28/2022 11:03 AM

## 2022-05-29 ENCOUNTER — Inpatient Hospital Stay (HOSPITAL_COMMUNITY)
Admission: AD | Admit: 2022-05-29 | Discharge: 2022-05-29 | Disposition: A | Discharge status: Home / Self Care | Payer: Medicaid Other | Attending: Obstetrics and Gynecology | Admitting: Obstetrics and Gynecology

## 2022-05-29 DIAGNOSIS — Z880 Allergy status to penicillin: Secondary | ICD-10-CM | POA: Insufficient documentation

## 2022-05-29 DIAGNOSIS — R109 Unspecified abdominal pain: Secondary | ICD-10-CM

## 2022-05-29 DIAGNOSIS — O26891 Other specified pregnancy related conditions, first trimester: Secondary | ICD-10-CM | POA: Diagnosis not present

## 2022-05-29 DIAGNOSIS — R1030 Lower abdominal pain, unspecified: Secondary | ICD-10-CM | POA: Diagnosis present

## 2022-05-29 DIAGNOSIS — O99340 Other mental disorders complicating pregnancy, unspecified trimester: Secondary | ICD-10-CM | POA: Diagnosis not present

## 2022-05-29 DIAGNOSIS — Z87891 Personal history of nicotine dependence: Secondary | ICD-10-CM | POA: Insufficient documentation

## 2022-05-29 DIAGNOSIS — Z3A01 Less than 8 weeks gestation of pregnancy: Secondary | ICD-10-CM | POA: Insufficient documentation

## 2022-05-29 DIAGNOSIS — M545 Low back pain, unspecified: Secondary | ICD-10-CM | POA: Insufficient documentation

## 2022-05-29 DIAGNOSIS — F419 Anxiety disorder, unspecified: Secondary | ICD-10-CM

## 2022-05-29 LAB — URINALYSIS, ROUTINE W REFLEX MICROSCOPIC
Bilirubin Urine: NEGATIVE
Glucose, UA: NEGATIVE mg/dL
Hgb urine dipstick: NEGATIVE
Ketones, ur: NEGATIVE mg/dL
Leukocytes,Ua: NEGATIVE
Nitrite: NEGATIVE
Protein, ur: NEGATIVE mg/dL
Specific Gravity, Urine: 1.023 (ref 1.005–1.030)
pH: 6 (ref 5.0–8.0)

## 2022-05-29 NOTE — MAU Provider Note (Signed)
History     CSN: [id number]  Arrival date and time: 05/29/22 1951   Event Date/Time   First Provider Initiated Contact with Patient 05/29/22 2040      Chief Complaint  Patient presents with   Abdominal Pain   Back Pain   HPI  [name] is a 22 y.o. female G77P0020 @ [redacted]w[redacted]d here in MAU with complaints of lower abdominal pain and lower back pain. The pain has been ongoing since she found out she was pregnant. She has no bleeding.  This is her 7th visit to MAU in this pregnancy with concerns. She reports high anxiety and stress due to SAB x 2 and the uncertainty of this pregnancy. She has no bleeding. She has no other health conditions other than recurrent SAB, anxiety and PCOS. She is seeing [facility] and has her 2nd OB visit next Friday.   She declines treatment for anxiety; mom is an addict and she does not want to take medication of any kind.    OB History     Gravida  3   Para      Term      Preterm      AB  2   Living         SAB  2   IAB      Ectopic      Multiple      Live Births              Past Medical History:  Diagnosis Date   Cysts of both ovaries    Depression    PCOS (polycystic ovarian syndrome)    UTI (urinary tract infection)     Past Surgical History:  Procedure Laterality Date   NO PAST SURGERIES      Family History  Problem Relation Age of Onset   Drug abuse Mother        od from Fentanyl   Other Father        unknown, uninvolved   Cancer Maternal Grandmother        stage 4 breast    Social History   Tobacco Use   Smoking status: Former   Smokeless tobacco: Never   Tobacco comments:    Quit vaping 01/2021  Vaping Use   Vaping Use: Former  Substance Use Topics   Alcohol use: Not Currently    Comment: occ   Drug use: Never    Allergies:  Allergies  Allergen Reactions   Latex     Burning and inflammation (vaginal)   Amoxicillin Other (See Comments)    Yeast infection    Medications Prior to Admission   Medication Sig Dispense Refill Last Dose   Prenatal Vit-Fe Fumarate-FA (PRENATAL COMPLETE) 14-0.4 MG TABS Take 1 tablet by mouth daily. 60 tablet 3    progesterone (PROMETRIUM) 200 MG capsule SMARTSIG:2 Capsule(s) Vaginal Twice Daily      vitamin B-6 (PYRIDOXINE) 25 MG tablet Take 1 tablet (25 mg total) by mouth every 6 (six) hours as needed. 14 tablet 1    Results for orders placed or performed during the hospital encounter of 05/29/22 (from the past 48 hour(s))  Urinalysis, Routine w reflex microscopic -Urine, Clean Catch     Status: Abnormal   Collection Time: 05/29/22  8:49 PM  Result Value Ref Range   Color, Urine YELLOW YELLOW   APPearance HAZY (A) CLEAR   Specific Gravity, Urine 1.023 1.005 - 1.030   pH 6.0 5.0 - 8.0   Glucose, UA  NEGATIVE NEGATIVE mg/dL   Hgb urine dipstick NEGATIVE NEGATIVE   Bilirubin Urine NEGATIVE NEGATIVE   Ketones, ur NEGATIVE NEGATIVE mg/dL   Protein, ur NEGATIVE NEGATIVE mg/dL   Nitrite NEGATIVE NEGATIVE   Leukocytes,Ua NEGATIVE NEGATIVE    Comment: Performed at [facility] [address], [place], [place] [address]     Review of Systems  Constitutional:  Negative for fever.  Gastrointestinal:  Positive for abdominal pain.  Genitourinary:  Negative for vaginal bleeding and vaginal discharge.   Physical Exam   Blood pressure 111/67, pulse 73, temperature 98.5 F (36.9 C), temperature source Oral, resp. rate 19, height 5\' 5"  (1.651 m), weight 50.3 kg, last menstrual period 04/10/2022, SpO2 100 %, unknown if currently breastfeeding.  Physical Exam Constitutional:      General: She is not in acute distress.    Appearance: She is well-developed. She is not ill-appearing, toxic-appearing or diaphoretic.  Abdominal:     Tenderness: There is generalized abdominal tenderness. There is no guarding or rebound.  Neurological:     Mental Status: She is alert and oriented to person, place, and time.  Psychiatric:        Mood and Affect: Mood is  anxious and depressed.    MAU Course  Procedures  MDM  [place] x 3 in past visits shows [place] discussion with the patient regarding multiple visits to MAU in just a short period of time. She reports she has good support at home and no one is hurting her. She attests to a lot of anxiety with this pregnancy and feels like the pregnancy could end like her last 2 pregnancies. Reassurance provided to the patient that abdominal pain in pregnancy can be normal as the uterus is growing and stretching. Encouraged her to try meditation, tylenol and stretching.   Assessment and Plan   1. Abdominal pain in pregnancy, first trimester   2. [redacted] weeks gestation of pregnancy   3. Anxiousness      P:  Dc home Return to MAU for OB emergencies only Follow up with OB as scheduled First trimester warning signs reviewed Urine culture pending.    Lima Chillemi 05/29/2022, 10:14 PM

## 2022-05-29 NOTE — MAU Note (Signed)
..  Elizabeth Wheeler is a 22 y.o. at [redacted]w[redacted]d here in MAU reporting: yesterday she began having left sided lower abdominal pain that radiates to her back. It hurts more when she is sitting up and feels period-like.  Denies vaginal bleeding.  Pain score: 6/10 Vitals:   05/29/22 2030  BP: 111/67  Pulse: 73  Resp: 19  Temp: 98.5 F (36.9 C)  SpO2: 100%     FHT:not indicated Lab orders placed from triage:  UA

## 2022-05-31 LAB — CULTURE, OB URINE: Culture: NO GROWTH

## 2022-06-03 ENCOUNTER — Inpatient Hospital Stay (HOSPITAL_COMMUNITY)
Admission: AD | Admit: 2022-06-03 | Discharge: 2022-06-03 | Disposition: A | Discharge status: Home / Self Care | Payer: Medicaid Other | Attending: Obstetrics and Gynecology | Admitting: Obstetrics and Gynecology

## 2022-06-03 DIAGNOSIS — F419 Anxiety disorder, unspecified: Secondary | ICD-10-CM

## 2022-06-03 DIAGNOSIS — M549 Dorsalgia, unspecified: Secondary | ICD-10-CM | POA: Diagnosis not present

## 2022-06-03 DIAGNOSIS — Z3A01 Less than 8 weeks gestation of pregnancy: Secondary | ICD-10-CM | POA: Diagnosis not present

## 2022-06-03 DIAGNOSIS — O99341 Other mental disorders complicating pregnancy, first trimester: Secondary | ICD-10-CM | POA: Insufficient documentation

## 2022-06-03 DIAGNOSIS — M545 Low back pain, unspecified: Secondary | ICD-10-CM | POA: Diagnosis not present

## 2022-06-03 DIAGNOSIS — O26891 Other specified pregnancy related conditions, first trimester: Secondary | ICD-10-CM | POA: Diagnosis not present

## 2022-06-03 DIAGNOSIS — Z3490 Encounter for supervision of normal pregnancy, unspecified, unspecified trimester: Secondary | ICD-10-CM

## 2022-06-03 LAB — URINALYSIS, ROUTINE W REFLEX MICROSCOPIC
Bilirubin Urine: NEGATIVE
Glucose, UA: NEGATIVE mg/dL
Hgb urine dipstick: NEGATIVE
Ketones, ur: NEGATIVE mg/dL
Leukocytes,Ua: NEGATIVE
Nitrite: NEGATIVE
Protein, ur: NEGATIVE mg/dL
Specific Gravity, Urine: 1.009 (ref 1.005–1.030)
pH: 6 (ref 5.0–8.0)

## 2022-06-03 NOTE — Progress Notes (Signed)
Written and verbal d/c instructions given and understanding voiced. 

## 2022-06-03 NOTE — MAU Note (Signed)
.  Elizabeth Wheeler is a 22 y.o. at [redacted]w[redacted]d here in MAU reporting pain in L side for 3 days. Pain comes and goes and lower stomach is "heavy when I pee". Pain is sharp and "shocking" .Some noticeable odor to vag d/c that is not foul. Thinks maybe from progesterone supp.  Onset of complaint: 3 days Pain score: 7 Vitals:   06/03/22 1936 06/03/22 1939  BP:  127/74  Pulse: (!) 103   Resp: 17   Temp: 98.7 F (37.1 C)   SpO2: 100%      FHT:n/a Lab orders placed from triage:  u/a

## 2022-06-03 NOTE — MAU Note (Signed)
Dr Ernestina Patches in to talk with pt earlier and discuss plan of care. Bedside u/s done by MD and pt reassured

## 2022-06-03 NOTE — MAU Provider Note (Signed)
History     CSN: [id number]  Arrival date and time: 06/03/22 1912   Event Date/Time   First Provider Initiated Contact with Patient 06/03/22 2223      Chief Complaint  Patient presents with   Back Pain   Patient is 22 y.o. Y1P5093 [redacted]w[redacted]d here with complaints of low back pain that is new for the past 5 days. She reports the same cramping she had at prior MAU visits. This is her 8th evaluation in the MAU. She is nervous about losing this pregnancy. She reports losing her other pregnancies at about 7 weeks. She denies bleeding or worsening cramping. She reports feeling of fullness in her abdomen. No dysuria, polyuria. She has back pain that is located in the bilateral low back. She denies tingling or numbness in her legs. She reports lifting more recently and her book bag is very heavy. She is a full time Ship broker. Marland Kitchen  denies LOF, VB, contractions, vaginal discharge.   OB History     Gravida  3   Para      Term      Preterm      AB  2   Living         SAB  2   IAB      Ectopic      Multiple      Live Births              Past Medical History:  Diagnosis Date   Cysts of both ovaries    Depression    PCOS (polycystic ovarian syndrome)    UTI (urinary tract infection)     Past Surgical History:  Procedure Laterality Date   NO PAST SURGERIES      Family History  Problem Relation Age of Onset   Drug abuse Mother        od from Fentanyl   Other Father        unknown, uninvolved   Cancer Maternal Grandmother        stage 4 breast    Social History   Tobacco Use   Smoking status: Former   Smokeless tobacco: Never   Tobacco comments:    Quit vaping 01/2021  Vaping Use   Vaping Use: Former  Substance Use Topics   Alcohol use: Not Currently    Comment: occ   Drug use: Never    Allergies:  Allergies  Allergen Reactions   Latex     Burning and inflammation (vaginal)   Amoxicillin Other (See Comments)    Yeast infection    Medications Prior  to Admission  Medication Sig Dispense Refill Last Dose   Prenatal Vit-Fe Fumarate-FA (PRENATAL COMPLETE) 14-0.4 MG TABS Take 1 tablet by mouth daily. 60 tablet 3    progesterone (PROMETRIUM) 200 MG capsule SMARTSIG:2 Capsule(s) Vaginal Twice Daily      vitamin B-6 (PYRIDOXINE) 25 MG tablet Take 1 tablet (25 mg total) by mouth every 6 (six) hours as needed. 14 tablet 1     Review of Systems  Constitutional:  Negative for chills and fever.  HENT:  Negative for congestion and sore throat.   Eyes:  Negative for pain and visual disturbance.  Respiratory:  Negative for cough, chest tightness and shortness of breath.   Cardiovascular:  Negative for chest pain.  Gastrointestinal:  Negative for abdominal pain, diarrhea, nausea and vomiting.  Endocrine: Negative for cold intolerance and heat intolerance.  Genitourinary:  Negative for dysuria and flank pain.  Musculoskeletal:  Positive  for back pain.  Skin:  Negative for rash.  Allergic/Immunologic: Negative for food allergies.  Neurological:  Negative for dizziness and light-headedness.  Psychiatric/Behavioral:  Negative for agitation.    Physical Exam   Blood pressure 127/74, pulse (!) 103, temperature 98.7 F (37.1 C), resp. rate 17, height 5\' 5"  (1.651 m), weight 49 kg, last menstrual period 04/10/2022, SpO2 100 %, unknown if currently breastfeeding.  Physical Exam Vitals and nursing note reviewed.  Constitutional:      General: She is not in acute distress.    Appearance: She is well-developed.     Comments: Pregnant female  HENT:     Head: Normocephalic and atraumatic.  Eyes:     General: No scleral icterus.    Conjunctiva/sclera: Conjunctivae normal.  Cardiovascular:     Rate and Rhythm: Normal rate.  Pulmonary:     Effort: Pulmonary effort is normal.  Chest:     Chest wall: No tenderness.  Abdominal:     Palpations: Abdomen is soft.     Tenderness: There is no abdominal tenderness. There is no guarding or rebound.      Comments: Gravid  Genitourinary:    Vagina: Normal.  Musculoskeletal:        General: Normal range of motion.     Cervical back: Normal range of motion and neck supple.  Skin:    General: Skin is warm and dry.     Findings: No rash.  Neurological:     Mental Status: She is alert and oriented to person, place, and time.     MAU Course  Procedures  MDM- LOW  Performed bedside US that showed normal Gestational [place] with fetal pole and a flicker. Did not performed doppler.   Patient visualized the [place] in real time.   She has follow up on Friday with [facility] and has an [place] that day per her report.   Reviewed her past labs that are significant for a normally rising BHCG  669-->1042-->4175  Last [place] was 1/25- showed live IUP with FHR 94 BPM  Assessment and Plan   Early stage of pregnancy - Discussed her normal anxiety given prior experience with losses.  - No bleeding  - BSUS is reassuring but discussed it is limited and she will have an [place] at her primary OB group   Musculoskeletal back pain -Recommended heat to back -Discussed safety of tylenol and patient declines dose in MAU because she took tylenol in prior pregnancies and does not want to take anything.   Anxiety Provided empathetic listening and validation of her anxiety Also discussed that everything so far is reassuring and I do not recommend a formal US  Discussed that if her anxiety persists after the first trimester she might need to discuss with her providers   Discharge home Reviewed returning for bleeding or other OB emergencies.    Juanita Craver [facility] 06/03/2022, 10:28 PM

## 2022-06-06 ENCOUNTER — Inpatient Hospital Stay (HOSPITAL_COMMUNITY): Payer: Medicaid Other

## 2022-06-06 ENCOUNTER — Inpatient Hospital Stay (HOSPITAL_COMMUNITY)
Admission: AD | Admit: 2022-06-06 | Discharge: 2022-06-06 | Disposition: A | Discharge status: Home / Self Care | Payer: Medicaid Other | Attending: Obstetrics & Gynecology | Admitting: Obstetrics & Gynecology

## 2022-06-06 DIAGNOSIS — O021 Missed abortion: Secondary | ICD-10-CM | POA: Diagnosis not present

## 2022-06-06 DIAGNOSIS — Z3A01 Less than 8 weeks gestation of pregnancy: Secondary | ICD-10-CM

## 2022-06-06 NOTE — MAU Provider Note (Signed)
History     CSN: [id number]  Arrival date and time: 06/06/22 1116  Chief Complaint  Patient presents with   Second Opinion   HPI  Elizabeth Wheeler is a 22 y.o. G3P0020 at [redacted]w[redacted]d who presents for evaluation of status of her pregnancy. Patient reports she went to her new OB appointment today and they told her they could only see a gestational sac. She was told she had a fetus with a heart rate on 1/22 so she came to MAU for a second option.  She denies any pain.She denies any vaginal bleeding, discharge, and leaking of fluid. Denies any constipation, diarrhea or any urinary complaints.   OB History     Gravida  3   Para      Term      Preterm      AB  2   Living         SAB  2   IAB      Ectopic      Multiple      Live Births              Past Medical History:  Diagnosis Date   Cysts of both ovaries    Depression    PCOS (polycystic ovarian syndrome)    UTI (urinary tract infection)     Past Surgical History:  Procedure Laterality Date   NO PAST SURGERIES      Family History  Problem Relation Age of Onset   Drug abuse Mother        od from Fentanyl   Other Father        unknown, uninvolved   Cancer Maternal Grandmother        stage 4 breast    Social History   Tobacco Use   Smoking status: Former   Smokeless tobacco: Never   Tobacco comments:    Quit vaping 01/2021  Vaping Use   Vaping Use: Former  Substance Use Topics   Alcohol use: Not Currently    Comment: occ   Drug use: Never    Allergies:  Allergies  Allergen Reactions   Latex     Burning and inflammation (vaginal)   Amoxicillin Other (See Comments)    Yeast infection    No medications prior to admission.    Review of Systems Physical Exam   Blood pressure 120/79, pulse 79, temperature 98.6 F (37 C), temperature source Oral, resp. rate 16, height 5\' 5"  (1.651 m), weight 49.2 kg, last menstrual period 04/10/2022, SpO2 100 %, unknown if currently  breastfeeding.  Patient Vitals for the past 24 hrs:  BP Temp Temp src Pulse Resp SpO2 Height Weight  06/06/22 1210 120/79 98.6 F (37 C) Oral 79 16 100 % 5\' 5"  (1.651 m) 49.2 kg    Physical Exam  Fetal Tracing:  Baseline: Variability: Accels: Decels:  Toco:      MAU Course  Procedures  No results found for this or any previous visit (from the past 24 hour(s)).   US OB Transvaginal  Result Date: 06/06/2022 CLINICAL DATA:  Abdominal pain EXAM: OBSTETRIC <14 WK ULTRASOUND TECHNIQUE: Transabdominal ultrasound was performed for evaluation of the gestation as well as the maternal uterus and adnexal regions. COMPARISON:  05/26/2022 FINDINGS: Intrauterine gestational sac: Single. Heterogeneous material within the gestational sac with irregular contours. Yolk sac:  Visualized. Embryo:  Not Visualized. Cardiac Activity: Not Visualized. MSD: 13.4  mm   6 w   1  d Subchorionic hemorrhage:  None visualized.  Maternal uterus/adnexae: Right corpus luteal cyst. IMPRESSION: Single intrauterine gestational sac without a fetal pole and heterogeneous material within the gestational sac. Overall irregular contour of the gestational sac. Findings are suspicious but not yet definitive for failed pregnancy. Recommend follow-up US in 10-14 days for definitive diagnosis. This recommendation follows SRU consensus guidelines: Diagnostic Criteria for Nonviable Pregnancy Early in the First Trimester. [place] Med 2013; 628:3662-94. Electronically Signed   By: Kathreen Devoid M.D.   On: 06/06/2022 14:10     MDM Labs ordered and reviewed.   Patient very upset with [facility] today and states she felt dismissed and not cared for. She requests to no longer see them for OB care.   US OB Transvaginal  CNM independently reviewed the imaging ordered. Imaging show IUP with no FHR   CNM consulted with Dr. Rip Harbour regarding presentation and results- MD reviewed ultrasound on 1/22 and today and confirms missed AB  CNM informed  patient of results of ultrasound showing missed AB. Condolences provided and space given for patient to process emotions. CNM discussed options for management including expectant management, cytotec and surgical management. Risks and benefits of each reviewed at length. Patient desires surgical management and verbalized understanding of risks and benefits of this method.   Message sent to surgery scheduler.   Assessment and Plan   1. Missed abortion   2. [redacted] weeks gestation of pregnancy    -Discharge home in stable condition -Vaginal bleeding and pain precautions discussed -Patient advised to follow-up with [facility] for surgery next week -Patient may return to MAU as needed or if her condition were to change or worsen  Wende Mott, CNM 06/06/2022, 2:24 PM

## 2022-06-06 NOTE — MAU Note (Signed)
Elizabeth Wheeler is a 22 y.o. at [redacted]w[redacted]d here in MAU reporting: had OB appt, MD came in after [place], said they didn't see a fetus, only saw a gestational sac. The last [place] here, had a fetus with a heartbeat. Was told to basically prepare for miscarriage.  Here for a 2nd opinion. Denies any bleeding or passing of clots. Is on progesterone.  Slight cramping in lower abd.- onoing problem.  Onset of complaint: today Pain score: 2 Vitals:   06/06/22 1210  BP: 120/79  Pulse: 79  Resp: 16  Temp: 98.6 F (37 C)  SpO2: 100%      Lab orders placed from triage:  none

## 2022-06-09 ENCOUNTER — Telehealth (HOSPITAL_BASED_OUTPATIENT_CLINIC_OR_DEPARTMENT_OTHER): Payer: Self-pay | Admitting: Obstetrics & Gynecology

## 2022-06-09 ENCOUNTER — Other Ambulatory Visit: Payer: Self-pay

## 2022-06-09 ENCOUNTER — Encounter (HOSPITAL_BASED_OUTPATIENT_CLINIC_OR_DEPARTMENT_OTHER): Payer: Self-pay | Admitting: Obstetrics & Gynecology

## 2022-06-09 NOTE — Telephone Encounter (Signed)
Telephone call to patient with surgery date/time/instructions.    Posted for 06/10/22 [facility] at 3pm.  Instructed patient to arrive at 1pm NPO.

## 2022-06-09 NOTE — Progress Notes (Signed)
Spoke w/ via phone for pre-op interview---pt Lab needs dos----no orders by dr. Sabra Heck              Lab results------n/a COVID test -----patient states asymptomatic no test needed Arrive at -------1315 NPO after MN NO Solid Food.  Clear liquids from MN until---1215 Med rec completed Medications to take morning of surgery -----none Diabetic medication -----n/a Patient instructed no nail polish to be worn day of surgery Patient instructed to bring photo id and insurance card day of surgery Patient aware to have Driver (ride ) /  Elizabeth Wheeler caregiver    for 24 hours after surgery  Patient Special Instructions -----no smoking 24 hrs before surgery  Pre-Op special Istructions -----none Patient verbalized understanding of instructions that were given at this phone interview. Patient denies shortness of breath, chest pain, fever, cough at this phone interview.

## 2022-06-10 ENCOUNTER — Encounter (HOSPITAL_BASED_OUTPATIENT_CLINIC_OR_DEPARTMENT_OTHER): Payer: Self-pay | Admitting: Obstetrics & Gynecology

## 2022-06-10 ENCOUNTER — Other Ambulatory Visit: Payer: Self-pay

## 2022-06-10 ENCOUNTER — Ambulatory Visit (HOSPITAL_BASED_OUTPATIENT_CLINIC_OR_DEPARTMENT_OTHER): Payer: Medicaid Other | Admitting: Anesthesiology

## 2022-06-10 ENCOUNTER — Encounter (HOSPITAL_BASED_OUTPATIENT_CLINIC_OR_DEPARTMENT_OTHER)
Admission: RE | Disposition: A | Discharge status: Home / Self Care | Payer: Self-pay | Source: Home / Self Care | Attending: Obstetrics & Gynecology

## 2022-06-10 ENCOUNTER — Ambulatory Visit (HOSPITAL_COMMUNITY): Payer: Medicaid Other

## 2022-06-10 ENCOUNTER — Other Ambulatory Visit (HOSPITAL_BASED_OUTPATIENT_CLINIC_OR_DEPARTMENT_OTHER): Payer: Self-pay | Admitting: Obstetrics & Gynecology

## 2022-06-10 ENCOUNTER — Ambulatory Visit (HOSPITAL_BASED_OUTPATIENT_CLINIC_OR_DEPARTMENT_OTHER)
Admission: RE | Admit: 2022-06-10 | Discharge: 2022-06-10 | Disposition: A | Discharge status: Home / Self Care | Payer: Medicaid Other | Attending: Obstetrics & Gynecology | Admitting: Obstetrics & Gynecology

## 2022-06-10 DIAGNOSIS — O020 Blighted ovum and nonhydatidiform mole: Secondary | ICD-10-CM

## 2022-06-10 DIAGNOSIS — O021 Missed abortion: Secondary | ICD-10-CM | POA: Diagnosis not present

## 2022-06-10 DIAGNOSIS — N96 Recurrent pregnancy loss: Secondary | ICD-10-CM | POA: Diagnosis not present

## 2022-06-10 DIAGNOSIS — Z87891 Personal history of nicotine dependence: Secondary | ICD-10-CM | POA: Diagnosis not present

## 2022-06-10 DIAGNOSIS — Z3A00 Weeks of gestation of pregnancy not specified: Secondary | ICD-10-CM

## 2022-06-10 DIAGNOSIS — Z3A01 Less than 8 weeks gestation of pregnancy: Secondary | ICD-10-CM

## 2022-06-10 HISTORY — PX: DILATION AND EVACUATION: SHX1459

## 2022-06-10 LAB — CBC
HCT: 35 % — ABNORMAL LOW (ref 36.0–46.0)
Hemoglobin: 11.8 g/dL — ABNORMAL LOW (ref 12.0–15.0)
MCH: 30.7 pg (ref 26.0–34.0)
MCHC: 33.7 g/dL (ref 30.0–36.0)
MCV: 91.1 fL (ref 80.0–100.0)
Platelets: 316 10*3/uL (ref 150–400)
RBC: 3.84 MIL/uL — ABNORMAL LOW (ref 3.87–5.11)
RDW: 12.4 % (ref 11.5–15.5)
WBC: 7.9 10*3/uL (ref 4.0–10.5)
nRBC: 0 % (ref 0.0–0.2)

## 2022-06-10 LAB — TYPE AND SCREEN
ABO/RH(D): A POS
Antibody Screen: NEGATIVE

## 2022-06-10 SURGERY — DILATION AND EVACUATION, UTERUS
Anesthesia: General | Site: Vagina

## 2022-06-10 MED ORDER — HYDROCODONE-ACETAMINOPHEN 5-325 MG PO TABS: 1.0000 | ORAL_TABLET | Freq: Four times a day (QID) | ORAL | 0 refills | Status: DC | PRN

## 2022-06-10 MED ORDER — POVIDONE-IODINE 10 % EX SWAB: 2.0000 | Freq: Once | CUTANEOUS | Status: DC

## 2022-06-10 MED ORDER — ONDANSETRON HCL 4 MG/2ML IJ SOLN
INTRAMUSCULAR | Status: AC
  Filled 2022-06-10: qty 2

## 2022-06-10 MED ORDER — FENTANYL CITRATE (PF) 100 MCG/2ML IJ SOLN: 25.0000 ug | INTRAMUSCULAR | Status: DC | PRN

## 2022-06-10 MED ORDER — PROPOFOL 10 MG/ML IV BOLUS
INTRAVENOUS | Status: DC | PRN
  Administered 2022-06-10: 200 mg via INTRAVENOUS

## 2022-06-10 MED ORDER — FENTANYL CITRATE (PF) 100 MCG/2ML IJ SOLN
INTRAMUSCULAR | Status: AC
  Filled 2022-06-10: qty 2

## 2022-06-10 MED ORDER — KETOROLAC TROMETHAMINE 30 MG/ML IJ SOLN
INTRAMUSCULAR | Status: AC
  Filled 2022-06-10: qty 2

## 2022-06-10 MED ORDER — 0.9 % SODIUM CHLORIDE (POUR BTL) OPTIME
TOPICAL | Status: DC | PRN
  Administered 2022-06-10: 1000 mL

## 2022-06-10 MED ORDER — METHYLERGONOVINE MALEATE 0.2 MG/ML IJ SOLN
INTRAMUSCULAR | Status: AC
  Filled 2022-06-10: qty 1

## 2022-06-10 MED ORDER — LIDOCAINE-EPINEPHRINE 1 %-1:100000 IJ SOLN
INTRAMUSCULAR | Status: AC
  Filled 2022-06-10: qty 1

## 2022-06-10 MED ORDER — ACETAMINOPHEN 500 MG PO TABS
ORAL_TABLET | ORAL | Status: AC
  Filled 2022-06-10: qty 2

## 2022-06-10 MED ORDER — LIDOCAINE-EPINEPHRINE (PF) 1 %-1:200000 IJ SOLN
INTRAMUSCULAR | Status: DC | PRN
  Administered 2022-06-10: 10 mL

## 2022-06-10 MED ORDER — ACETAMINOPHEN 500 MG PO TABS: 1000.0000 mg | ORAL_TABLET | ORAL | Status: DC

## 2022-06-10 MED ORDER — PROPOFOL 10 MG/ML IV BOLUS
INTRAVENOUS | Status: AC
  Filled 2022-06-10: qty 20

## 2022-06-10 MED ORDER — FENTANYL CITRATE (PF) 100 MCG/2ML IJ SOLN
INTRAMUSCULAR | Status: DC | PRN
  Administered 2022-06-10: 50 ug via INTRAVENOUS

## 2022-06-10 MED ORDER — PROPOFOL 500 MG/50ML IV EMUL
INTRAVENOUS | Status: DC | PRN
  Administered 2022-06-10: 200 ug/kg/min via INTRAVENOUS

## 2022-06-10 MED ORDER — MISOPROSTOL 200 MCG PO TABS
ORAL_TABLET | ORAL | Status: AC
  Filled 2022-06-10: qty 5

## 2022-06-10 MED ORDER — LIDOCAINE HCL (PF) 2 % IJ SOLN
INTRAMUSCULAR | Status: AC
  Filled 2022-06-10: qty 10

## 2022-06-10 MED ORDER — ONDANSETRON HCL 4 MG/2ML IJ SOLN
INTRAMUSCULAR | Status: DC | PRN
  Administered 2022-06-10: 4 mg via INTRAVENOUS

## 2022-06-10 MED ORDER — EPHEDRINE SULFATE-NACL 50-0.9 MG/10ML-% IV SOSY
PREFILLED_SYRINGE | INTRAVENOUS | Status: DC | PRN
  Administered 2022-06-10: 10 mg via INTRAVENOUS

## 2022-06-10 MED ORDER — MIDAZOLAM HCL 2 MG/2ML IJ SOLN
INTRAMUSCULAR | Status: AC
  Filled 2022-06-10: qty 2

## 2022-06-10 MED ORDER — ACETAMINOPHEN 500 MG PO TABS
1000.0000 mg | ORAL_TABLET | Freq: Once | ORAL | Status: AC
  Administered 2022-06-10: 1000 mg via ORAL

## 2022-06-10 MED ORDER — PROPOFOL 500 MG/50ML IV EMUL
INTRAVENOUS | Status: AC
  Filled 2022-06-10: qty 50

## 2022-06-10 MED ORDER — LIDOCAINE 2% (20 MG/ML) 5 ML SYRINGE
INTRAMUSCULAR | Status: DC | PRN
  Administered 2022-06-10: 60 mg via INTRAVENOUS

## 2022-06-10 MED ORDER — IBUPROFEN 800 MG PO TABS: 800.0000 mg | ORAL_TABLET | Freq: Three times a day (TID) | ORAL | 0 refills | Status: AC | PRN

## 2022-06-10 MED ORDER — KETOROLAC TROMETHAMINE 30 MG/ML IJ SOLN
INTRAMUSCULAR | Status: DC | PRN
  Administered 2022-06-10: 30 mg via INTRAVENOUS

## 2022-06-10 MED ORDER — EPHEDRINE 5 MG/ML INJ
INTRAVENOUS | Status: AC
  Filled 2022-06-10: qty 5

## 2022-06-10 MED ORDER — DEXAMETHASONE SODIUM PHOSPHATE 10 MG/ML IJ SOLN
INTRAMUSCULAR | Status: DC | PRN
  Administered 2022-06-10: 4 mg via INTRAVENOUS

## 2022-06-10 MED ORDER — SODIUM CHLORIDE 0.9 % IV SOLN
100.0000 mg | Freq: Once | INTRAVENOUS | Status: AC
  Administered 2022-06-10: 100 mg via INTRAVENOUS
  Filled 2022-06-10: qty 100

## 2022-06-10 MED ORDER — LACTATED RINGERS IV SOLN: INTRAVENOUS | Status: DC

## 2022-06-10 MED ORDER — MIDAZOLAM HCL 5 MG/5ML IJ SOLN
INTRAMUSCULAR | Status: DC | PRN
  Administered 2022-06-10: 2 mg via INTRAVENOUS

## 2022-06-10 SURGICAL SUPPLY — 24 items
CATH ROBINSON RED A/P 16FR (CATHETERS) ×2 IMPLANT
DRSG TELFA 3X8 NADH STRL (GAUZE/BANDAGES/DRESSINGS) ×2 IMPLANT
FILTER UTR ASPR ASSEMBLY (MISCELLANEOUS) ×2 IMPLANT
GAUZE 4X4 16PLY ~~LOC~~+RFID DBL (SPONGE) ×2 IMPLANT
GLOVE BIOGEL PI IND STRL 7.0 (GLOVE) ×2 IMPLANT
GLOVE ECLIPSE 6.5 STRL STRAW (GLOVE) ×2 IMPLANT
GOWN STRL REUS W/TWL XL LVL3 (GOWN DISPOSABLE) ×2 IMPLANT
HOSE CONNECTING 18IN BERKELEY (TUBING) ×2 IMPLANT
KIT BERKELEY 1ST TRI 3/8 NO TR (MISCELLANEOUS) ×2 IMPLANT
KIT BERKELEY 1ST TRIMESTER 3/8 (MISCELLANEOUS) ×4 IMPLANT
KIT TURNOVER CYSTO (KITS) ×2 IMPLANT
NS IRRIG 500ML POUR BTL (IV SOLUTION) ×2 IMPLANT
PACK VAGINAL MINOR WOMEN LF (CUSTOM PROCEDURE TRAY) ×2 IMPLANT
PAD OB MATERNITY 4.3X12.25 (PERSONAL CARE ITEMS) ×2 IMPLANT
PAD PREP 24X48 CUFFED NSTRL (MISCELLANEOUS) ×2 IMPLANT
SCOPETTES 8  STERILE (MISCELLANEOUS)
SCOPETTES 8 STERILE (MISCELLANEOUS) IMPLANT
SET BERKELEY SUCTION TUBING (SUCTIONS) ×2 IMPLANT
TOWEL OR 17X26 10 PK STRL BLUE (TOWEL DISPOSABLE) ×2 IMPLANT
TRAP TISSUE FILTER (MISCELLANEOUS) ×4 IMPLANT
VACURETTE 10 RIGID CVD (CANNULA) IMPLANT
VACURETTE 7MM CVD STRL WRAP (CANNULA) IMPLANT
VACURETTE 8 RIGID CVD (CANNULA) IMPLANT
VACURETTE 9 RIGID CVD (CANNULA) IMPLANT

## 2022-06-10 NOTE — Discharge Instructions (Signed)

## 2022-06-10 NOTE — Op Note (Signed)
06/10/2022  2:56 PM  PATIENT:  Elizabeth Wheeler  22 y.o. female  PRE-OPERATIVE DIAGNOSIS:  MAB  POST-OPERATIVE DIAGNOSIS:  MAB  PROCEDURE:  Procedure(s): DILATATION AND EVACUATION CHROMOSOME STUDIES  SURGEON:  Megan Salon  ASSISTANTS: OR staff.    ANESTHESIA:   general  ESTIMATED BLOOD LOSS: 50 mL  BLOOD ADMINISTERED:none   FLUIDS: 600cc LR  UOP: pt voided before going back to the OR  SPECIMEN:  products of conception  DISPOSITION OF SPECIMEN:  PATHOLOGY  FINDINGS: intra op ultrasound prior to procedure showed gestational sac that was fully removed during the procedure.  Thin endometrium present at end of procedure documented with ultrasound  DESCRIPTION OF OPERATION: Patient was taken to the operating room.  She is placed in the supine position. SCDs were on her lower extremities and functioning properly. General anesthesia with an LMA was administered without difficulty. Dr. Ola Spurr, anesthesia, oversaw case.  Legs were then placed in the [place] in the low lithotomy position. The legs were lifted to the high lithotomy position and the Betadine prep was used on the inner thighs perineum and vagina x3. Patient was draped in a normal standard fashion. 8A bivalve speculum was placed the vagina. The anterior lip of the cervix was grasped with single-tooth tenaculum.  A paracervical block of 1% lidocaine mixed one-to-one with epinephrine (1:100,000 units).  10 cc was used total. The cervix is dilated up to #[facility] dilators. The endometrial cavity sounded to 8 cm.  A #7 curved suction tip was passed to the fundus under ultrasound guidance.  Suction applied.  Products of conception present in the suction device.  Device rotated in clockwise fashion until gestational sac was removed.  Then suction device removed.  Endometrium was thin at this point.  A #1 smooth curette was obtained and the endometrial cavity was curetted until a rough gritty texture was noted in all  quadrants.  Then the suction tip was passed again to the fundus and the suction applied.  Suction rotated in a clock wise fashion to ensure no additional tissue was present.  Ultrasound again confirmed thin endometrium.    At this point, the procedure was ended.  The tenaculum was removed from the anterior lip of the cervix. The speculum was removed from the vagina. The prep was cleansed of the patient's skin. The legs are positioned back in the supine position. Sponge, lap, needle, instrument counts were correct x2. Patient was taken to recovery in stable condition.  COUNTS:  YES  PLAN OF CARE: Transfer to PACU

## 2022-06-10 NOTE — Transfer of Care (Signed)
Immediate Anesthesia Transfer of Care Note  Patient: Elizabeth Wheeler  Procedure(s) Performed: DILATATION AND EVACUATION (Vagina ) CHROMOSOME STUDIES (Vagina )  Patient Location: PACU  Anesthesia Type:General  Level of Consciousness: awake, alert , oriented, and patient cooperative  Airway & Oxygen Therapy: Patient Spontanous Breathing  Post-op Assessment: Report given to RN and Post -op Vital signs reviewed and stable  Post vital signs: Reviewed and stable  Last Vitals:  Vitals Value Taken Time  BP 116/60 06/10/22 1501  Temp 36.5 C 06/10/22 1500  Pulse 72 06/10/22 1504  Resp 16 06/10/22 1504  SpO2 100 % 06/10/22 1504  Vitals shown include unvalidated device data.  Last Pain:  Vitals:   06/10/22 1238  TempSrc: Oral  PainSc: 0-No pain      Patients Stated Pain Goal: 6 (16/60/63 0160)  Complications: No notable events documented.

## 2022-06-10 NOTE — Anesthesia Preprocedure Evaluation (Addendum)
Anesthesia Evaluation  Patient identified by MRN, date of birth, ID band Patient awake    Reviewed: Allergy & Precautions, H&P , NPO status , Patient's Chart, lab work & pertinent test results  Airway Mallampati: II  TM Distance: >3 FB Neck ROM: Full    Dental no notable dental hx. (+) Teeth Intact, Dental Advisory Given   Pulmonary former smoker   Pulmonary exam normal breath sounds clear to auscultation       Cardiovascular negative cardio ROS  Rhythm:Regular Rate:Normal     Neuro/Psych    Depression    negative neurological ROS     GI/Hepatic negative GI ROS, Neg liver ROS,,,  Endo/Other  negative endocrine ROS    Renal/GU negative Renal ROS  negative genitourinary   Musculoskeletal   Abdominal   Peds  Hematology negative hematology ROS (+)   Anesthesia Other Findings   Reproductive/Obstetrics negative OB ROS                             Anesthesia Physical Anesthesia Plan  ASA: 2  Anesthesia Plan: General   Post-op Pain Management: Tylenol PO (pre-op)* and Toradol IV (intra-op)*   Induction: Intravenous  PONV Risk Score and Plan: 4 or greater and Ondansetron, Dexamethasone, Propofol infusion, TIVA and Midazolam  Airway Management Planned: LMA  Additional Equipment:   Intra-op Plan:   Post-operative Plan: Extubation in OR  Informed Consent: I have reviewed the patients History and Physical, chart, labs and discussed the procedure including the risks, benefits and alternatives for the proposed anesthesia with the patient or authorized representative who has indicated his/her understanding and acceptance.     Dental advisory given  Plan Discussed with: CRNA  Anesthesia Plan Comments:        Anesthesia Quick Evaluation

## 2022-06-10 NOTE — Anesthesia Procedure Notes (Signed)
Procedure Name: LMA Insertion Date/Time: 06/10/2022 2:27 PM  Performed by: Rogers Blocker, CRNAPre-anesthesia Checklist: Patient identified, Emergency Drugs available, Suction available and Patient being monitored Patient Re-evaluated:Patient Re-evaluated prior to induction Oxygen Delivery Method: Circle System Utilized Preoxygenation: Pre-oxygenation with 100% oxygen Induction Type: IV induction Ventilation: Mask ventilation without difficulty LMA: LMA inserted LMA Size: 3.0 Number of attempts: 1 Airway Equipment and Method: Bite block Placement Confirmation: positive ETCO2 Tube secured with: Tape Dental Injury: Teeth and Oropharynx as per pre-operative assessment

## 2022-06-10 NOTE — H&P (Signed)
Elizabeth Wheeler is an 22 y.o. female. G57A2 SAAF here for treatment of anembryonic pregnancy with suction D&C.  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  Procedure, risks and benefits discussed.  Risks including but not limited to infection, retained tissue with need for future procedure, injury to uterus possibly affecting fertility, injury to abdominal organs, blood transfusion all discussed.  Questions asked.  Pt is not interested in outpatient treatment and does not want to wait for a miscarriage either.  Pt ready to proceed.  Pertinent Gynecological History: DES exposure: denies Blood transfusions: none Sexually transmitted diseases: no past history Previous GYN Procedures:  none   Last mammogram: N/A Last pap: n/a OB History: G3, P0, A2   Menstrual History: Patient's last menstrual period was 04/10/2022.    Past Medical History:  Diagnosis Date   Cysts of both ovaries    Depression    PCOS (polycystic ovarian syndrome)    UTI (urinary tract infection)     Past Surgical History:  Procedure Laterality Date   NO PAST SURGERIES      Family History  Problem Relation Age of Onset   Drug abuse Mother        od from Fentanyl   Other Father        unknown, uninvolved   Cancer Maternal Grandmother        stage 4 breast    Social History:  reports that she has quit smoking. She has never used smokeless tobacco. She reports that she does not currently use alcohol. She reports that she does not currently use drugs after having used the following drugs: Marijuana.  Allergies:  Allergies  Allergen Reactions   Latex     Burning and inflammation (vaginal)   Amoxicillin Other (See Comments)    Yeast infection    Medications Prior to Admission  Medication Sig Dispense Refill Last Dose   acetaminophen (TYLENOL) 500 MG tablet Take 500 mg by mouth every 6 (six) hours as needed for mild  pain.   06/09/2022   cholecalciferol (VITAMIN D3) 25 MCG (1000 UNIT) tablet Take 1,000 Units by mouth daily.   06/06/2022   Prenatal Vit-Fe Fumarate-FA (PRENATAL COMPLETE) 14-0.4 MG TABS Take 1 tablet by mouth daily. 60 tablet 3 06/06/2022   progesterone (PROMETRIUM) 200 MG capsule Place 200 mg vaginally 2 (two) times daily. Stopped taking in on 06/06/2022   06/06/2022   vitamin B-6 (PYRIDOXINE) 25 MG tablet Take 1 tablet (25 mg total) by mouth every 6 (six) hours as needed. 14 tablet 1 06/06/2022    Review of Systems  All other systems reviewed and are negative.   Blood pressure 110/64, pulse 66, temperature 97.7 F (36.5 C), temperature source Oral, resp. rate 15, height 5\' 5"  (1.651 m), weight 51.6 kg, last menstrual period 04/10/2022, SpO2 100 %, unknown if currently breastfeeding. Physical Exam Constitutional:      Appearance: Normal appearance.  Cardiovascular:     Rate and Rhythm: Normal rate and regular rhythm.  Pulmonary:     Effort: Pulmonary effort is normal.     Breath sounds: Normal breath sounds.  Neurological:     General: No focal deficit present.     Mental Status: She is alert.  Psychiatric:        Mood and Affect: Mood normal.     Results for orders placed or performed during the hospital encounter of 06/10/22 (from the past  24 hour(s))  CBC     Status: Abnormal   Collection Time: 06/10/22 12:40 PM  Result Value Ref Range   WBC 7.9 4.0 - 10.5 K/uL   RBC 3.84 (L) 3.87 - 5.11 MIL/uL   Hemoglobin 11.8 (L) 12.0 - 15.0 g/dL   HCT 35.0 (L) 36.0 - 46.0 %   MCV 91.1 80.0 - 100.0 fL   MCH 30.7 26.0 - 34.0 pg   MCHC 33.7 30.0 - 36.0 g/dL   RDW 12.4 11.5 - 15.5 %   Platelets 316 150 - 400 K/uL   nRBC 0.0 0.0 - 0.2 %  Type and screen     Status: None (Preliminary result)   Collection Time: 06/10/22 12:40 PM  Result Value Ref Range   ABO/RH(D) PENDING    Antibody Screen PENDING    Sample Expiration      06/13/2022,2359 Performed at [facility], [place]  [address], [place], [place] [address]     No results found.  Assessment/Plan: 22 yo G3P0A1 SAAF with anembryonic pregnancy here for suction D&C for definitive treatment.  Questions answered.  Pt ready to proceed.  Megan Salon 06/10/2022, 1:56 PM

## 2022-06-10 NOTE — Anesthesia Postprocedure Evaluation (Signed)
Anesthesia Post Note  Patient: Elizabeth Wheeler  Procedure(s) Performed: DILATATION AND EVACUATION (Vagina ) CHROMOSOME STUDIES (Vagina )     Patient location during evaluation: PACU Anesthesia Type: General Level of consciousness: awake and alert Pain management: pain level controlled Vital Signs Assessment: post-procedure vital signs reviewed and stable Respiratory status: spontaneous breathing, nonlabored ventilation and respiratory function stable Cardiovascular status: blood pressure returned to baseline and stable Postop Assessment: no apparent nausea or vomiting Anesthetic complications: no  No notable events documented.  Last Vitals:  Vitals:   06/10/22 1515 06/10/22 1530  BP: 101/64 108/70  Pulse: 84 65  Resp: 20 19  Temp:    SpO2: 100% 100%    Last Pain:  Vitals:   06/10/22 1515  TempSrc:   PainSc: 0-No pain                 Liylah Najarro,W. EDMOND

## 2022-06-11 ENCOUNTER — Encounter (HOSPITAL_BASED_OUTPATIENT_CLINIC_OR_DEPARTMENT_OTHER): Payer: Self-pay | Admitting: Obstetrics & Gynecology

## 2022-06-11 LAB — RPR: RPR Ser Ql: NONREACTIVE

## 2022-06-11 LAB — SURGICAL PATHOLOGY

## 2022-06-17 LAB — ANORA MISCARRIAGE TEST - FRESH

## 2022-06-23 ENCOUNTER — Encounter: Payer: Self-pay | Admitting: *Deleted

## 2022-07-09 ENCOUNTER — Inpatient Hospital Stay (HOSPITAL_COMMUNITY): Payer: Medicaid Other

## 2022-07-09 ENCOUNTER — Ambulatory Visit: Payer: Self-pay

## 2022-07-09 ENCOUNTER — Other Ambulatory Visit: Payer: Self-pay

## 2022-07-09 ENCOUNTER — Inpatient Hospital Stay (HOSPITAL_COMMUNITY)
Admission: AD | Admit: 2022-07-09 | Discharge: 2022-07-09 | Disposition: A | Discharge status: Home / Self Care | Payer: Medicaid Other | Attending: Obstetrics & Gynecology | Admitting: Obstetrics & Gynecology

## 2022-07-09 DIAGNOSIS — O209 Hemorrhage in early pregnancy, unspecified: Secondary | ICD-10-CM | POA: Insufficient documentation

## 2022-07-09 DIAGNOSIS — Z3A12 12 weeks gestation of pregnancy: Secondary | ICD-10-CM | POA: Diagnosis not present

## 2022-07-09 DIAGNOSIS — O99281 Endocrine, nutritional and metabolic diseases complicating pregnancy, first trimester: Secondary | ICD-10-CM | POA: Insufficient documentation

## 2022-07-09 DIAGNOSIS — N939 Abnormal uterine and vaginal bleeding, unspecified: Secondary | ICD-10-CM | POA: Diagnosis not present

## 2022-07-09 DIAGNOSIS — O99321 Drug use complicating pregnancy, first trimester: Secondary | ICD-10-CM | POA: Insufficient documentation

## 2022-07-09 NOTE — Telephone Encounter (Signed)
    Chief Complaint: Vaginal bleeding after D and C in February. Has dark discharge. Pian 6/10. Symptoms: Feels tired Frequency: Last month Pertinent Negatives: Patient denies fever Disposition: '[x]'$ ED /'[]'$ Urgent Care (no appt availability in office) / '[]'$ Appointment(In office/virtual)/ '[]'$  [facility]/ '[]'$ Home Care/ '[]'$ Refused Recommended Disposition /'[]'$ [facility]/ '[]'$  Follow-up with PCP Additional Notes: Will have someone drive her.  Reason for Disposition  Patient sounds very sick or weak to the triager  Answer Assessment - Initial Assessment Questions 1. AMOUNT: "Describe the bleeding that you are having."    - SPOTTING: spotting, or pinkish / brownish mucous discharge; does not fill panty liner or pad    - MILD:  less than 1 pad / hour; less than patient's usual menstrual bleeding   - MODERATE: 1-2 pads / hour; 1 menstrual cup every 6 hours; small-medium blood clots (e.g., pea, grape, small coin)   - SEVERE: soaking 2 or more pads/hour for 2 or more hours; 1 menstrual cup every 2 hours; bleeding not contained by pads or continuous red blood from vagina; large blood clots (e.g., golf ball, large coin)      Moderate 2. ONSET: "When did the bleeding begin?" "Is it continuing now?"     4 days 3. MENSTRUAL PERIOD: "When was the last normal menstrual period?" "How is this different than your period?"     Feb 4. REGULARITY: "How regular are your periods?"     Yes 5. ABDOMEN PAIN: "Do you have any pain?" "How bad is the pain?"  (e.g., Scale 1-10; mild, moderate, or severe)   - MILD (1-3): doesn't interfere with normal activities, abdomen soft and not tender to touch    - MODERATE (4-7): interferes with normal activities or awakens from sleep, abdomen tender to touch    - SEVERE (8-10): excruciating pain, doubled over, unable to do any normal activities      Moderate 6. PREGNANCY: "Is there any chance you are pregnant?" "When was your last menstrual period?"     No -  miscarry  7. BREASTFEEDING: "Are you breastfeeding?"     No 8. HORMONE MEDICINES: "Are you taking any hormone medicines, prescription or over-the-counter?" (e.g., birth control pills, estrogen)     No 9. BLOOD THINNER MEDICINES: "Do you take any blood thinners?" (e.g., Coumadin / warfarin, Pradaxa / dabigatran, aspirin)     No 10. CAUSE: "What do you think is causing the bleeding?" (e.g., recent gyn surgery, recent gyn procedure; known bleeding disorder, cervical cancer, polycystic ovarian disease, fibroids)         N/a 11. HEMODYNAMIC STATUS: "Are you weak or feeling lightheaded?" If Yes, ask: "Can you stand and walk normally?"        Feels weak 12. OTHER SYMPTOMS: "What other symptoms are you having with the bleeding?" (e.g., passed tissue, vaginal discharge, fever, menstrual-type cramps)       Cramps  Protocols used: Vaginal Bleeding - Abnormal-A-AH

## 2022-07-09 NOTE — MAU Provider Note (Signed)
History     [id number]  Arrival date and time: 07/09/22 1349    No chief complaint on file.    HPI Elizabeth Wheeler is a 22 y.o. at 59w0dwith PMHx notable for recent D&C due to 3rd miscarriage, who presents for vaginal bleeding.   Patient had D&C for anembryonic pregnancy on 06/10/2022, op note reviewed and was uncomplicated She reports some unusual yellowish discharge for a few days afterwards which stopped after four days followed by 2 weeks of progressively lighter bleeding  She then had one week with no bleeding Over the past few days though has had brownish spotting like her period is about to come on but hasn't yet This is different than her 2 prior miscarriages though she reports doing medical management with those No vaginal discharge or odor No burning or pain with urination   --/--/A POS (02/06 1240)  OB History     Gravida  3   Para      Term      Preterm      AB  2   Living         SAB  2   IAB      Ectopic      Multiple  0   Live Births              Past Medical History:  Diagnosis Date   Cysts of both ovaries    Depression    PCOS (polycystic ovarian syndrome)    UTI (urinary tract infection)     Past Surgical History:  Procedure Laterality Date   DILATION AND EVACUATION N/A 06/10/2022   Procedure: DILATATION AND EVACUATION;  Surgeon: MMegan Salon MD;  Location: [facility]  Service: Gynecology;  Laterality: N/A;   NO PAST SURGERIES      Family History  Problem Relation Age of Onset   Drug abuse Mother        od from Fentanyl   Other Father        unknown, uninvolved   Cancer Maternal Grandmother        stage 4 breast    Social History   Socioeconomic History   Marital status: Significant Other    Spouse name: Not on file   Number of children: Not on file   Years of education: Not on file   Highest education level: Not on file  Occupational History   Not on file  Tobacco Use   Smoking status:  Former   Smokeless tobacco: Never   Tobacco comments:    Quit vaping 01/2021  Vaping Use   Vaping Use: Former  Substance and Sexual Activity   Alcohol use: Not Currently   Drug use: Not Currently    Types: Marijuana    Comment: last smoked week 5 months ago. 06/09/2022   Sexual activity: Not Currently  Other Topics Concern   Not on file  Social History Narrative   Not on file   Social Determinants of Health   Financial Resource Strain: Not on file  Food Insecurity: Not on file  Transportation Needs: Not on file  Physical Activity: Not on file  Stress: Not on file  Social Connections: Not on file  Intimate Partner Violence: Not on file    Allergies  Allergen Reactions   Latex     Burning and inflammation (vaginal)   Amoxicillin Other (See Comments)    Yeast infection    No current facility-administered medications on file prior to encounter.  Current Outpatient Medications on File Prior to Encounter  Medication Sig Dispense Refill   acetaminophen (TYLENOL) 500 MG tablet Take 500 mg by mouth every 6 (six) hours as needed for mild pain.     cholecalciferol (VITAMIN D3) 25 MCG (1000 UNIT) tablet Take 1,000 Units by mouth daily.     HYDROcodone-acetaminophen (NORCO/VICODIN) 5-325 MG tablet Take 1-2 tablets by mouth every 6 (six) hours as needed for moderate pain. 12 tablet 0   ibuprofen (ADVIL) 800 MG tablet Take 1 tablet (800 mg total) by mouth every 8 (eight) hours as needed. 30 tablet 0   Prenatal Vit-Fe Fumarate-FA (PRENATAL COMPLETE) 14-0.4 MG TABS Take 1 tablet by mouth daily. 60 tablet 3   vitamin B-6 (PYRIDOXINE) 25 MG tablet Take 1 tablet (25 mg total) by mouth every 6 (six) hours as needed. 14 tablet 1     ROS Pertinent positives and negative per HPI, all others reviewed and negative  Physical Exam   BP 115/67 (BP Location: Right Arm)   Pulse (!) 104   Temp 98.5 F (36.9 C) (Oral)   Resp 20   Ht '5\' 5"'$  (1.651 m)   Wt 51.4 kg   LMP 04/10/2022   SpO2  98%   BMI 18.85 kg/m   Patient Vitals for the past 24 hrs:  BP Temp Temp src Pulse Resp SpO2 Height Weight  07/09/22 1407 115/67 98.5 F (36.9 C) Oral (!) 104 20 98 % -- --  07/09/22 1400 -- -- -- -- -- -- '5\' 5"'$  (1.651 m) 51.4 kg    Physical Exam Vitals reviewed.  Constitutional:      General: She is not in acute distress.    Appearance: She is well-developed. She is not diaphoretic.  Eyes:     General: No scleral icterus. Pulmonary:     Effort: Pulmonary effort is normal. No respiratory distress.  Skin:    General: Skin is warm and dry.  Neurological:     Mental Status: She is alert.     Coordination: Coordination normal.      Cervical Exam    Bedside Ultrasound Not done  My interpretation: n/a  Labs No results found for this or any previous visit (from the past 24 hour(s)).  Imaging US PELVIS TRANSVAGINAL NON-OB (TV ONLY)  Result Date: 07/09/2022 CLINICAL DATA:  [id number] S/P DANDC (status post dilation and curettage) [id number] EXAM: ULTRASOUND PELVIS TRANSVAGINAL TECHNIQUE: Transvaginal ultrasound examination of the pelvis was performed including evaluation of the uterus, ovaries, adnexal regions, and pelvic cul-de-sac. COMPARISON:  None Available. FINDINGS: Uterus Measurements: 7.3 x 3.8 x 4.5 cm = volume: 65.1 mL. No fibroids or other mass visualized. Endometrium Thickness: 2.8 mm, normal. No focal abnormality visualized. No fluid in the endometrial canal. Right ovary Measurements: 3.2 x 1.8 x 2.4 cm = volume: 7.1 mL. Normal appearance/no adnexal mass. Detectable color Doppler flow. Left ovary Measurements: 2.7 x 2.1 x 2.5 cm = volume: 7.4 mL. Normal appearance/no adnexal mass. Detectable color Doppler flow. Other findings:  No abnormal free fluid IMPRESSION: Unremarkable pelvic ultrasound. Electronically Signed   By: Maurine Simmering M.D.   On: 07/09/2022 15:45    MAU Course  Procedures Lab Orders  No laboratory test(s) ordered today   No orders of the defined types were  placed in this encounter.  Imaging Orders         US PELVIS TRANSVAGINAL NON-OB (TV ONLY)     MDM moderate  Assessment and Plan  #Abnormal uterine bleeding S/p D&C with  bleeding outside her normal pattern, [place] today is reassuring against retained POC's. Discussed with patient that most likely this is just her cycle regulating after D&C. Has follow up appointment with Dr. Sabra Heck tomorrow, encouraged her to keep that appointment to discuss testing for recurrent miscarriage.     Dispo: discharged to home in stable condition.    Clarnce Flock, MD/MPH 07/09/22 4:05 PM  Allergies as of 07/09/2022       Reactions   Latex    Burning and inflammation (vaginal)   Amoxicillin Other (See Comments)   Yeast infection        Medication List     TAKE these medications    acetaminophen 500 MG tablet Commonly known as: TYLENOL Take 500 mg by mouth every 6 (six) hours as needed for mild pain.   cholecalciferol 25 MCG (1000 UNIT) tablet Commonly known as: VITAMIN D3 Take 1,000 Units by mouth daily.   HYDROcodone-acetaminophen 5-325 MG tablet Commonly known as: NORCO/VICODIN Take 1-2 tablets by mouth every 6 (six) hours as needed for moderate pain.   ibuprofen 800 MG tablet Commonly known as: ADVIL Take 1 tablet (800 mg total) by mouth every 8 (eight) hours as needed.   Prenatal Complete 14-0.4 MG Tabs Take 1 tablet by mouth daily.   pyridOXINE 25 MG tablet Commonly known as: VITAMIN B6 Take 1 tablet (25 mg total) by mouth every 6 (six) hours as needed.

## 2022-07-09 NOTE — MAU Note (Signed)
Elizabeth Wheeler is a 22 y.o. at 23w0dhere in MAU reporting: had a D&C on 06/11/2022 after a miscarriage last month and began having VB today, states VB had stopped once D&C.  Reports began having cramping  and VB, states VB is very dark.  Reports changing pad every 4-5 hours, denies passing clots.  States she thought it may be her cycle starting, but unsure. F/U appt scheduled tomorrow LMP: 04/10/2022 Onset of complaint: today Pain score: 5 Vitals:   07/09/22 1407  BP: 115/67  Pulse: (!) 104  Resp: 20  Temp: 98.5 F (36.9 C)  SpO2: 98%     FHT:NA Lab orders placed from triage:   None

## 2022-07-10 ENCOUNTER — Encounter (HOSPITAL_BASED_OUTPATIENT_CLINIC_OR_DEPARTMENT_OTHER): Payer: Self-pay | Admitting: Obstetrics & Gynecology

## 2022-07-10 ENCOUNTER — Ambulatory Visit (INDEPENDENT_AMBULATORY_CARE_PROVIDER_SITE_OTHER): Payer: Medicaid Other | Admitting: Obstetrics & Gynecology

## 2022-07-10 ENCOUNTER — Other Ambulatory Visit (HOSPITAL_COMMUNITY)
Admission: RE | Admit: 2022-07-10 | Discharge: 2022-07-10 | Disposition: A | Discharge status: Home / Self Care | Payer: Medicaid Other | Source: Ambulatory Visit | Attending: Obstetrics & Gynecology | Admitting: Obstetrics & Gynecology

## 2022-07-10 VITALS — BP 122/83 | HR 97 | Ht 65.0 in | Wt 113.0 lb

## 2022-07-10 DIAGNOSIS — N96 Recurrent pregnancy loss: Secondary | ICD-10-CM | POA: Diagnosis not present

## 2022-07-10 DIAGNOSIS — Z124 Encounter for screening for malignant neoplasm of cervix: Secondary | ICD-10-CM | POA: Insufficient documentation

## 2022-07-10 NOTE — Progress Notes (Signed)
GYNECOLOGY  VISIT  CC:   follow up after D&C, recurrent miscarriages  HPI: 22 y.o. G3P0030 Significant Other Black or African American female here for follow up after D&E.  She did have some dark irregular bleeding that was unlike her two prior miscarriages.  She has had two prior miscarriages before this one.  She miscarried without intervention the previous two times.  Discussed differences that can be seen after D&E.  She did go to the [facility] yesterday and ultrasound showed endometrium of 2.24mm.  Reassured pt about bleeding differences.    We discussed recurrent miscarriage evaluation including lupus anticoagulant testing, antiphospholipid antibody, possible SHG and chromosomal testing.  She would like to proceed with blood work today.  She did have anora testing with the D&E and this was negative.  Feel ok to wait to do chromosomal testing.  Pt comfortable with plan.  Pap is due this summer.  Pt ok having it done today.   Past Medical History:  Diagnosis Date   Cysts of both ovaries    Depression    History of recurrent miscarriages    PCOS (polycystic ovarian syndrome)     MEDS:   Current Outpatient Medications on File Prior to Visit  Medication Sig Dispense Refill   ibuprofen (ADVIL) 800 MG tablet Take 1 tablet (800 mg total) by mouth every 8 (eight) hours as needed. 30 tablet 0   acetaminophen (TYLENOL) 500 MG tablet Take 500 mg by mouth every 6 (six) hours as needed for mild pain. (Patient not taking: Reported on 07/10/2022)     cholecalciferol (VITAMIN D3) 25 MCG (1000 UNIT) tablet Take 1,000 Units by mouth daily. (Patient not taking: Reported on 07/10/2022)     HYDROcodone-acetaminophen (NORCO/VICODIN) 5-325 MG tablet Take 1-2 tablets by mouth every 6 (six) hours as needed for moderate pain. (Patient not taking: Reported on 07/10/2022) 12 tablet 0   Prenatal Vit-Fe Fumarate-FA (PRENATAL COMPLETE) 14-0.4 MG TABS Take 1 tablet by mouth daily. (Patient not taking: Reported on 07/10/2022) 60  tablet 3   vitamin B-6 (PYRIDOXINE) 25 MG tablet Take 1 tablet (25 mg total) by mouth every 6 (six) hours as needed. (Patient not taking: Reported on 07/10/2022) 14 tablet 1   No current facility-administered medications on file prior to visit.    ALLERGIES: Latex and Amoxicillin  SH:  single, non smoker  Review of Systems  Constitutional: Negative.   Genitourinary:        Dark, irregular bleeding    PHYSICAL EXAMINATION:    BP 122/83   Pulse 97   Ht 5\' 5"  (1.651 m)   Wt 113 lb (51.3 kg)   LMP 04/10/2022   Breastfeeding Unknown   BMI 18.80 kg/m     General appearance: alert, cooperative and appears stated age Abdomen: soft, non-tender; bowel sounds normal; no masses,  no organomegaly Lymph:  no inguinal LAD noted  Pelvic: External genitalia:  no lesions              Urethra:  normal appearing urethra with no masses, tenderness or lesions              Bartholins and Skenes: normal                 Vagina: normal appearing vagina with normal color and discharge, no lesions              Cervix: no lesions              Bimanual Exam:  Uterus:  normal size, contour, position, consistency, mobility, non-tender              Adnexa: no mass, fullness, tenderness               Chaperone, Octaviano Batty, CMA, was present for exam.  Assessment/Plan: 1. History of recurrent miscarriages - will start with some blood work for evaluation today. - Lupus anticoagulant panel( [facility]) - Antiphospholipid syndrome eval, bld - additional evaluation will be planned after these results are finalized  2. Screening for cervical cancer - Cytology - PAP( [place])

## 2022-07-12 LAB — LUPUS ANTICOAGULANT PANEL
Dilute Viper Venom Time: 26.3 s (ref 0.0–47.0)
PTT Lupus Anticoagulant: 30.3 s (ref 0.0–43.5)

## 2022-07-12 LAB — ANTIPHOSPHOLIPID SYNDROME EVAL, BLD
APTT PPP: 28.2 s (ref 22.9–30.2)
Anticardiolipin IgG: <9 GPL U/mL (ref 0–14)
Anticardiolipin IgM: <9 MPL U/mL (ref 0–12)
Beta-2 Glyco 1 IgM: <9 GPI IgM units (ref 0–32)
Beta-2 Glyco I IgG: <9 GPI IgG units (ref 0–20)
Hexagonal Phase Phospholipid: 4 s (ref 0–11)
INR: 1 (ref 0.9–1.2)
PT: 11 s (ref 9.1–12.0)
Thrombin Time: 19.7 s (ref 0.0–23.0)

## 2022-07-12 LAB — COAG STUDIES INTERP REPORT

## 2022-07-13 ENCOUNTER — Encounter (HOSPITAL_BASED_OUTPATIENT_CLINIC_OR_DEPARTMENT_OTHER): Payer: Self-pay | Admitting: Obstetrics & Gynecology

## 2022-07-17 LAB — CYTOLOGY - PAP: Diagnosis: NEGATIVE

## 2022-09-02 ENCOUNTER — Other Ambulatory Visit (HOSPITAL_BASED_OUTPATIENT_CLINIC_OR_DEPARTMENT_OTHER): Payer: Self-pay | Admitting: *Deleted

## 2022-09-02 DIAGNOSIS — N96 Recurrent pregnancy loss: Secondary | ICD-10-CM

## 2022-09-03 ENCOUNTER — Other Ambulatory Visit (HOSPITAL_BASED_OUTPATIENT_CLINIC_OR_DEPARTMENT_OTHER): Payer: Self-pay | Admitting: Obstetrics & Gynecology

## 2022-09-03 ENCOUNTER — Ambulatory Visit (INDEPENDENT_AMBULATORY_CARE_PROVIDER_SITE_OTHER): Payer: Medicaid Other

## 2022-09-03 ENCOUNTER — Ambulatory Visit (INDEPENDENT_AMBULATORY_CARE_PROVIDER_SITE_OTHER): Payer: Medicaid Other | Admitting: Obstetrics & Gynecology

## 2022-09-03 ENCOUNTER — Encounter (HOSPITAL_BASED_OUTPATIENT_CLINIC_OR_DEPARTMENT_OTHER): Payer: Self-pay | Admitting: Obstetrics & Gynecology

## 2022-09-03 VITALS — BP 120/97 | HR 96 | Ht 64.0 in | Wt 110.8 lb

## 2022-09-03 DIAGNOSIS — N96 Recurrent pregnancy loss: Secondary | ICD-10-CM

## 2022-09-03 MED ORDER — PROGESTERONE 200 MG PO CAPS: ORAL_CAPSULE | ORAL | 5 refills | Status: DC

## 2022-09-03 MED ORDER — ASPIRIN 81 MG PO TBEC: 81.0000 mg | DELAYED_RELEASE_TABLET | Freq: Every day | ORAL | 12 refills | Status: AC

## 2022-09-08 NOTE — Progress Notes (Signed)
GYNECOLOGY  VISIT  CC:   recurrent miscarriages  HPI: 22 y.o. G3P0030 Significant Other Black or African American female here for continued evaluation for recurrent miscarriages.  Lupus anticoagulant and antiphospholipid antibody testing negative previously.    Sonohysterogram performed today and this was negative as well.  We discussed starting a baby ASA now and using vaginal progesterone 200mg  twcie daily with next positive pregnancy testing.  Literature supporting these interventions discussed as data is not conclusive.  As these are low risk interventions, feel worth proceeding with both.  Lastly, she does want significant other to have a semen analysis.  Kit provided with instructions as well as order sent for him to proceed.   All questions answered.  Importance of no smoking, no marijuana, decreased caffeine and alcohol all discussed as well.   Past Medical History:  Diagnosis Date   Cysts of both ovaries    Depression    History of recurrent miscarriages    PCOS (polycystic ovarian syndrome)     MEDS:   Current Outpatient Medications on File Prior to Visit  Medication Sig Dispense Refill   ibuprofen (ADVIL) 800 MG tablet Take 1 tablet (800 mg total) by mouth every 8 (eight) hours as needed. 30 tablet 0   Prenatal Vit-Fe Fumarate-FA (PRENATAL COMPLETE) 14-0.4 MG TABS Take 1 tablet by mouth daily. (Patient not taking: Reported on 07/10/2022) 60 tablet 3   No current facility-administered medications on file prior to visit.    ALLERGIES: Latex and Amoxicillin  SH:  single, non smoker  Review of Systems  Constitutional: Negative.   Genitourinary: Negative.     PHYSICAL EXAMINATION:    BP (!) 120/97 (BP Location: Right Arm, Patient Position: Sitting, Cuff Size: Normal)   Pulse 96   Ht 5\' 4"  (1.626 m) Comment: Reported  Wt 110 lb 12.8 oz (50.3 kg)   BMI 19.02 kg/m     General appearance: alert, cooperative and appears stated age Lymph:  no inguinal LAD  noted  Pelvic: External genitalia:  no lesions              Urethra:  normal appearing urethra with no masses, tenderness or lesions              Bartholins and Skenes: normal                 Vagina: normal appearing vagina with normal color and discharge, no lesions              Cervix: no lesions              Bimanual Exam:  Uterus:  normal size, contour, position, consistency, mobility, non-tender      Chaperone, Darlene, was present for exam.  Assessment/Plan: 1. History of recurrent miscarriages - will start baby ASA - will use progesterone with next +UPT, 200mg  PV twice daily - significant other will proceed with semen analysis as well. Information given, kit given and order placed today.

## 2022-10-08 ENCOUNTER — Encounter (HOSPITAL_BASED_OUTPATIENT_CLINIC_OR_DEPARTMENT_OTHER): Payer: Self-pay | Admitting: Emergency Medicine

## 2022-10-08 ENCOUNTER — Other Ambulatory Visit: Payer: Self-pay

## 2022-10-08 DIAGNOSIS — R519 Headache, unspecified: Secondary | ICD-10-CM | POA: Diagnosis not present

## 2022-10-08 DIAGNOSIS — Z5321 Procedure and treatment not carried out due to patient leaving prior to being seen by health care provider: Secondary | ICD-10-CM | POA: Insufficient documentation

## 2022-10-08 DIAGNOSIS — R1030 Lower abdominal pain, unspecified: Secondary | ICD-10-CM | POA: Insufficient documentation

## 2022-10-08 DIAGNOSIS — N898 Other specified noninflammatory disorders of vagina: Secondary | ICD-10-CM | POA: Diagnosis not present

## 2022-10-08 LAB — URINALYSIS, ROUTINE W REFLEX MICROSCOPIC
Bilirubin Urine: NEGATIVE
Glucose, UA: NEGATIVE mg/dL
Hgb urine dipstick: NEGATIVE
Ketones, ur: NEGATIVE mg/dL
Nitrite: NEGATIVE
Specific Gravity, Urine: 1.024 (ref 1.005–1.030)
pH: 6.5 (ref 5.0–8.0)

## 2022-10-08 LAB — PREGNANCY, URINE: Preg Test, Ur: NEGATIVE

## 2022-10-08 NOTE — ED Triage Notes (Signed)
Lower abd pain  into back, headache. Some pink vaginal discharge Started today

## 2022-10-09 ENCOUNTER — Emergency Department (HOSPITAL_BASED_OUTPATIENT_CLINIC_OR_DEPARTMENT_OTHER)
Admission: EM | Admit: 2022-10-09 | Discharge: 2022-10-09 | Discharge status: Against Medical Advice | Payer: Medicaid Other | Attending: Emergency Medicine | Admitting: Emergency Medicine

## 2022-10-13 ENCOUNTER — Other Ambulatory Visit: Payer: Self-pay

## 2022-10-13 ENCOUNTER — Emergency Department (HOSPITAL_BASED_OUTPATIENT_CLINIC_OR_DEPARTMENT_OTHER)
Admission: EM | Admit: 2022-10-13 | Discharge: 2022-10-13 | Disposition: A | Discharge status: Home / Self Care | Payer: Medicaid Other | Attending: Emergency Medicine | Admitting: Emergency Medicine

## 2022-10-13 ENCOUNTER — Encounter (HOSPITAL_BASED_OUTPATIENT_CLINIC_OR_DEPARTMENT_OTHER): Payer: Self-pay | Admitting: Emergency Medicine

## 2022-10-13 DIAGNOSIS — Z7982 Long term (current) use of aspirin: Secondary | ICD-10-CM | POA: Diagnosis not present

## 2022-10-13 DIAGNOSIS — R1030 Lower abdominal pain, unspecified: Secondary | ICD-10-CM | POA: Insufficient documentation

## 2022-10-13 DIAGNOSIS — Z91040 Latex allergy status: Secondary | ICD-10-CM | POA: Insufficient documentation

## 2022-10-13 DIAGNOSIS — R109 Unspecified abdominal pain: Secondary | ICD-10-CM

## 2022-10-13 LAB — COMPREHENSIVE METABOLIC PANEL
ALT: 8 U/L (ref 0–44)
AST: 15 U/L (ref 15–41)
Albumin: 4.6 g/dL (ref 3.5–5.0)
Alkaline Phosphatase: 32 U/L — ABNORMAL LOW (ref 38–126)
Anion gap: 11 (ref 5–15)
BUN: 11 mg/dL (ref 6–20)
CO2: 21 mmol/L — ABNORMAL LOW (ref 22–32)
Calcium: 9.6 mg/dL (ref 8.9–10.3)
Chloride: 106 mmol/L (ref 98–111)
Creatinine, Ser: 0.73 mg/dL (ref 0.44–1.00)
GFR, Estimated: >60 mL/min (ref >60)
Glucose, Bld: 122 mg/dL — ABNORMAL HIGH (ref 70–99)
Potassium: 3.6 mmol/L (ref 3.5–5.1)
Sodium: 138 mmol/L (ref 135–145)
Total Bilirubin: 0.8 mg/dL (ref 0.3–1.2)
Total Protein: 7.3 g/dL (ref 6.5–8.1)

## 2022-10-13 LAB — CBC WITH DIFFERENTIAL/PLATELET
Abs Immature Granulocytes: 0.01 10*3/uL (ref 0.00–0.07)
Basophils Absolute: 0 10*3/uL (ref 0.0–0.1)
Basophils Relative: 0 %
Eosinophils Absolute: 0.1 10*3/uL (ref 0.0–0.5)
Eosinophils Relative: 1 %
HCT: 36.2 % (ref 36.0–46.0)
Hemoglobin: 12.6 g/dL (ref 12.0–15.0)
Immature Granulocytes: 0 %
Lymphocytes Relative: 30 %
Lymphs Abs: 2.1 10*3/uL (ref 0.7–4.0)
MCH: 30.5 pg (ref 26.0–34.0)
MCHC: 34.8 g/dL (ref 30.0–36.0)
MCV: 87.7 fL (ref 80.0–100.0)
Monocytes Absolute: 0.5 10*3/uL (ref 0.1–1.0)
Monocytes Relative: 7 %
Neutro Abs: 4.3 10*3/uL (ref 1.7–7.7)
Neutrophils Relative %: 62 %
Platelets: 317 10*3/uL (ref 150–400)
RBC: 4.13 MIL/uL (ref 3.87–5.11)
RDW: 13.1 % (ref 11.5–15.5)
WBC: 7.1 10*3/uL (ref 4.0–10.5)
nRBC: 0 % (ref 0.0–0.2)

## 2022-10-13 LAB — WET PREP, GENITAL
Clue Cells Wet Prep HPF POC: NONE SEEN
Sperm: NONE SEEN
Trich, Wet Prep: NONE SEEN
WBC, Wet Prep HPF POC: <10 (ref <10)
Yeast Wet Prep HPF POC: NONE SEEN

## 2022-10-13 LAB — URINALYSIS, ROUTINE W REFLEX MICROSCOPIC
Bilirubin Urine: NEGATIVE
Glucose, UA: NEGATIVE mg/dL
Hgb urine dipstick: NEGATIVE
Ketones, ur: NEGATIVE mg/dL
Leukocytes,Ua: NEGATIVE
Nitrite: NEGATIVE
Protein, ur: NEGATIVE mg/dL
Specific Gravity, Urine: 1.025 (ref 1.005–1.030)
pH: 6 (ref 5.0–8.0)

## 2022-10-13 LAB — HCG, QUANTITATIVE, PREGNANCY: hCG, Beta Chain, Quant, S: <1 m[IU]/mL (ref <5)

## 2022-10-13 NOTE — ED Notes (Signed)
Patient given instructions on self swabbing.  Patient verbalized understanding.

## 2022-10-13 NOTE — ED Provider Notes (Signed)
[place] EMERGENCY DEPARTMENT AT [facility] Provider Note   CSN: [id number] Arrival date & time: 10/13/22  7829     History  Chief Complaint  Patient presents with   Abdominal Pain   Vaginal Discharge    Elizabeth Wheeler is a 22 y.o. female.  With history of anxiety, depression, recurrent miscarriages, PCOS who presents to the ED for evaluation of lower abdominal cramping.  She states on Wednesday of last week she had some pink vaginal discharge along with the abdominal cramping.  States it felt like implantation cramping to her so she presented to the ED but left due to the wait time.  She then called her gynecologist on Friday and was encouraged to make an appointment this week if her symptoms did not improve.  Her abdominal cramping has improved and she has not had any vaginal discharge since Wednesday of last week.  She was encouraged to receive quantitative hCG if she does plan to return to the ED which is what she presents for today.  She reports being married and is sexually active with only her single female partner.  She has no concerns for STDs.  She denies vaginal pain, any discharge since last Wednesday, odor.  She denies urinary symptoms.  Denies fevers, chills, nausea, vomiting, upper abdominal pain.  LMP was 09/27/2022.   Abdominal Pain Associated symptoms: vaginal discharge   Vaginal Discharge Associated symptoms: abdominal pain        Home Medications Prior to Admission medications   Medication Sig Start Date End Date Taking? Authorizing Provider  aspirin EC 81 MG tablet Take 1 tablet (81 mg total) by mouth daily. Swallow whole. 09/03/22   Jerene Bears, MD  ibuprofen (ADVIL) 800 MG tablet Take 1 tablet (800 mg total) by mouth every 8 (eight) hours as needed. 06/10/22   Jerene Bears, MD  Prenatal Vit-Fe Fumarate-FA (PRENATAL COMPLETE) 14-0.4 MG TABS Take 1 tablet by mouth daily. Patient not taking: Reported on 07/10/2022 03/04/21   Shon Baton, MD   progesterone (PROMETRIUM) 200 MG capsule With positive pregnancy test, place one capsule vaginally twice weekly 09/03/22   Jerene Bears, MD      Allergies    Latex and Amoxicillin    Review of Systems   Review of Systems  Gastrointestinal:  Positive for abdominal pain.  Genitourinary:  Positive for vaginal discharge.  All other systems reviewed and are negative.   Physical Exam Updated Vital Signs BP (!) 160/113 (BP Location: Left Arm)   Pulse 99   Temp 97.8 F (36.6 C) (Oral)   Resp 18   Ht 5\' 4"  (1.626 m)   Wt 52.2 kg   LMP 09/27/2022   SpO2 100%   BMI 19.74 kg/m  Physical Exam Vitals and nursing note reviewed.  Constitutional:      General: She is not in acute distress.    Appearance: Normal appearance. She is normal weight. She is not ill-appearing.     Comments: Resting comfortably in bed  HENT:     Head: Normocephalic and atraumatic.  Pulmonary:     Effort: Pulmonary effort is normal. No respiratory distress.  Abdominal:     General: Abdomen is flat.     Tenderness: There is no abdominal tenderness. There is no guarding or rebound. Negative signs include McBurney's sign.  Musculoskeletal:        General: Normal range of motion.     Cervical back: Neck supple.  Skin:    General: Skin  is warm and dry.  Neurological:     Mental Status: She is alert and oriented to person, place, and time.  Psychiatric:        Mood and Affect: Mood normal.        Behavior: Behavior normal.     ED Results / Procedures / Treatments   Labs (all labs ordered are listed, but only abnormal results are displayed) Labs Reviewed  COMPREHENSIVE METABOLIC PANEL - Abnormal; Notable for the following components:      Result Value   CO2 21 (*)    Glucose, Bld 122 (*)    Alkaline Phosphatase 32 (*)    All other components within normal limits  WET PREP, GENITAL  CBC WITH DIFFERENTIAL/PLATELET  URINALYSIS, ROUTINE W REFLEX MICROSCOPIC  HCG, QUANTITATIVE, PREGNANCY  GC/CHLAMYDIA  PROBE AMP ([place]) NOT AT [facility]    EKG None  Radiology No results found.  Procedures Procedures    Medications Ordered in ED Medications - No data to display  ED Course/ Medical Decision Making/ A&P                             Medical Decision Making Amount and/or Complexity of Data Reviewed Labs: ordered.  This patient presents to the ED for concern of Differential diagnosis of her lower abdominal considerations include pelvic inflammatory disease, ectopic pregnancy, appendicitis, urinary calculi, primary dysmenorrhea, septic abortion, ruptured ovarian cyst or tumor, ovarian torsion, tubo-ovarian abscess, degeneration of fibroid, endometriosis, diverticulitis, cystitis. , this involves an extensive number of treatment options, and is a complaint that carries with it a high risk of complications and morbidity.  The differential diagnosis includes   Co morbidities that complicate the patient evaluation  anxiety, depression, recurrent miscarriages, PCOS  My initial workup includes labs  Additional history obtained from: Nursing notes from this visit.  I ordered, reviewed and interpreted labs which include: CBC, CMP, hCG, urinalysis, wet prep, GC/chlamydia.  Hyperglycemia of 122.  Labs otherwise reassuring.  hCG negative  Afebrile, hypertensive but otherwise hemodynamically stable.  22 year old female presenting to the ED for evaluation of lower abdominal cramping.  She did have some pink vaginal discharge on Wednesday of last week.  She is concerned about a possible pregnancy as she has had similar symptoms in the past and has had numerous miscarriages.  hCG is negative today.  She reports some mild lower abdominal cramping.  Her physical exam is benign.  She declined pelvic exam today.  She has very low suspicion for STD.  Lab workup was reassuring as well.  She was encouraged to call her gynecologist to schedule an appointment for follow-up visit.  She was given return  precautions.  Stable at discharge.  At this time there does not appear to be any evidence of an acute emergency medical condition and the patient appears stable for discharge with appropriate outpatient follow up. Diagnosis was discussed with patient who verbalizes understanding of care plan and is agreeable to discharge. I have discussed return precautions with patient who verbalizes understanding. Patient encouraged to follow-up with their PCP within 1 week. All questions answered.  Note: Portions of this report may have been transcribed using voice recognition software. Every effort was made to ensure accuracy; however, inadvertent computerized transcription errors may still be present.        Final Clinical Impression(s) / ED Diagnoses Final diagnoses:  Abdominal cramping    Rx / DC Orders ED Discharge Orders  None         Michelle Piper, Cordelia Poche 10/13/22 1150    Melene Plan, DO 10/13/22 1414

## 2022-10-13 NOTE — Discharge Instructions (Signed)
You have been seen today for your complaint of abdominal pain. Your lab work can be checked in your MyChart but was overall reassuring. Your discharge medications include Alternate tylenol and ibuprofen for pain. You may alternate these every 4 hours. You may take up to 800 mg of ibuprofen at a time and up to 1000 mg of tylenol. Follow up with: your gynecologist within the next week Please seek immediate medical care if you develop any of the following symptoms: You cannot stop vomiting. Your pain is only in one part of your belly, like on the right side. You have bloody or black poop, or poop that looks like tar. You have trouble breathing. You have chest pain. At this time there does not appear to be the presence of an emergent medical condition, however there is always the potential for conditions to change. Please read and follow the below instructions.  Do not take your medicine if  develop an itchy rash, swelling in your mouth or lips, or difficulty breathing; call 911 and seek immediate emergency medical attention if this occurs.  You may review your lab tests and imaging results in their entirety on your MyChart account.  Please discuss all results of fully with your primary care provider and other specialist at your follow-up visit.  Note: Portions of this text may have been transcribed using voice recognition software. Every effort was made to ensure accuracy; however, inadvertent computerized transcription errors may still be present.

## 2022-10-13 NOTE — ED Triage Notes (Signed)
Pt arrives to ED with c/o abdominal cramping and abnormal vaginal discharge.

## 2022-10-14 LAB — GC/CHLAMYDIA PROBE AMP (~~LOC~~) NOT AT ARMC
Chlamydia: NEGATIVE
Comment: NEGATIVE
Comment: NORMAL
Neisseria Gonorrhea: NEGATIVE

## 2022-10-22 ENCOUNTER — Telehealth (HOSPITAL_BASED_OUTPATIENT_CLINIC_OR_DEPARTMENT_OTHER): Payer: Self-pay | Admitting: *Deleted

## 2022-10-22 ENCOUNTER — Other Ambulatory Visit (HOSPITAL_BASED_OUTPATIENT_CLINIC_OR_DEPARTMENT_OTHER): Payer: Medicaid Other

## 2022-10-22 ENCOUNTER — Encounter (HOSPITAL_BASED_OUTPATIENT_CLINIC_OR_DEPARTMENT_OTHER): Payer: Self-pay

## 2022-10-22 NOTE — Telephone Encounter (Signed)
Patient left a message that she cannot cone today she has class.

## 2022-10-27 ENCOUNTER — Emergency Department (HOSPITAL_BASED_OUTPATIENT_CLINIC_OR_DEPARTMENT_OTHER): Payer: Medicaid Other | Admitting: Radiology

## 2022-10-27 ENCOUNTER — Other Ambulatory Visit: Payer: Self-pay

## 2022-10-27 ENCOUNTER — Encounter (HOSPITAL_BASED_OUTPATIENT_CLINIC_OR_DEPARTMENT_OTHER): Payer: Self-pay

## 2022-10-27 ENCOUNTER — Emergency Department (HOSPITAL_BASED_OUTPATIENT_CLINIC_OR_DEPARTMENT_OTHER)
Admission: EM | Admit: 2022-10-27 | Discharge: 2022-10-27 | Disposition: A | Discharge status: Home / Self Care | Payer: Medicaid Other | Attending: Emergency Medicine | Admitting: Emergency Medicine

## 2022-10-27 DIAGNOSIS — Z91040 Latex allergy status: Secondary | ICD-10-CM | POA: Insufficient documentation

## 2022-10-27 DIAGNOSIS — R1030 Lower abdominal pain, unspecified: Secondary | ICD-10-CM | POA: Diagnosis not present

## 2022-10-27 DIAGNOSIS — R110 Nausea: Secondary | ICD-10-CM | POA: Diagnosis not present

## 2022-10-27 DIAGNOSIS — R0789 Other chest pain: Secondary | ICD-10-CM | POA: Insufficient documentation

## 2022-10-27 DIAGNOSIS — R5383 Other fatigue: Secondary | ICD-10-CM | POA: Diagnosis not present

## 2022-10-27 DIAGNOSIS — N644 Mastodynia: Secondary | ICD-10-CM | POA: Diagnosis not present

## 2022-10-27 DIAGNOSIS — R079 Chest pain, unspecified: Secondary | ICD-10-CM | POA: Diagnosis present

## 2022-10-27 DIAGNOSIS — Z7982 Long term (current) use of aspirin: Secondary | ICD-10-CM | POA: Diagnosis not present

## 2022-10-27 LAB — URINALYSIS, ROUTINE W REFLEX MICROSCOPIC
Bilirubin Urine: NEGATIVE
Glucose, UA: NEGATIVE mg/dL
Hgb urine dipstick: NEGATIVE
Ketones, ur: NEGATIVE mg/dL
Leukocytes,Ua: NEGATIVE
Nitrite: NEGATIVE
Protein, ur: NEGATIVE mg/dL
Specific Gravity, Urine: 1.02 (ref 1.005–1.030)
pH: 7.5 (ref 5.0–8.0)

## 2022-10-27 LAB — COMPREHENSIVE METABOLIC PANEL
ALT: 10 U/L (ref 0–44)
AST: 16 U/L (ref 15–41)
Albumin: 4.8 g/dL (ref 3.5–5.0)
Alkaline Phosphatase: 39 U/L (ref 38–126)
Anion gap: 10 (ref 5–15)
BUN: 11 mg/dL (ref 6–20)
CO2: 23 mmol/L (ref 22–32)
Calcium: 9.9 mg/dL (ref 8.9–10.3)
Chloride: 104 mmol/L (ref 98–111)
Creatinine, Ser: 0.74 mg/dL (ref 0.44–1.00)
GFR, Estimated: >60 mL/min (ref >60)
Glucose, Bld: 96 mg/dL (ref 70–99)
Potassium: 3.7 mmol/L (ref 3.5–5.1)
Sodium: 137 mmol/L (ref 135–145)
Total Bilirubin: 0.5 mg/dL (ref 0.3–1.2)
Total Protein: 7.8 g/dL (ref 6.5–8.1)

## 2022-10-27 LAB — PREGNANCY, URINE: Preg Test, Ur: NEGATIVE

## 2022-10-27 LAB — CBC
HCT: 37.6 % (ref 36.0–46.0)
Hemoglobin: 12.8 g/dL (ref 12.0–15.0)
MCH: 30.3 pg (ref 26.0–34.0)
MCHC: 34 g/dL (ref 30.0–36.0)
MCV: 89.1 fL (ref 80.0–100.0)
Platelets: 345 10*3/uL (ref 150–400)
RBC: 4.22 MIL/uL (ref 3.87–5.11)
RDW: 13 % (ref 11.5–15.5)
WBC: 8.5 10*3/uL (ref 4.0–10.5)
nRBC: 0 % (ref 0.0–0.2)

## 2022-10-27 LAB — LIPASE, BLOOD: Lipase: 37 U/L (ref 11–51)

## 2022-10-27 LAB — TROPONIN I (HIGH SENSITIVITY): Troponin I (High Sensitivity): <2 ng/L (ref <18)

## 2022-10-27 NOTE — ED Notes (Signed)
Pt discharged home after verbalizing understanding of discharge instructions; nad noted. 

## 2022-10-27 NOTE — ED Triage Notes (Signed)
Patient here POV from Home.  Endorses CP for 1 Week. Constant. Central. Also notes LLQ to lower ABD Pain for 2 Weeks.   Seeks Pregnancy test for Late Menstrual Cycle. Mild SOB. No known Fevers. Some nausea. No Emesis or Diarrhea.   NAD Noted during triage. A&Ox4. GCS 15. Ambulatory.

## 2022-10-27 NOTE — Discharge Instructions (Signed)
You were seen in the emergency department for chest pain. Your labs, chest xray, and EKG were reassuring at this time. However I am unsure what it causing your symptoms. I would advise following up with your primary care provider/ OBGYN for further evaluation of your symptoms if you fele that you are not improving. If you feel that your symptoms are worsening, return to the ER for further evaluation.

## 2022-10-27 NOTE — ED Provider Notes (Signed)
[place] EMERGENCY DEPARTMENT AT [facility] Provider Note   CSN: [id number] Arrival date & time: 10/27/22  1104     History Chief Complaint  Patient presents with   Chest Pain    Elizabeth Wheeler is a 22 y.o. female.  Patient presents to the emergency department complaints of chest pain.  States that this has been ongoing for the last week or so and also notes some lower abdominal pain for the last 2 weeks.  He is currently on supplements for fertility as she is trying to conceive.  Reports that she is currently on her menstrual cycle and requesting pregnancy testing.  Endorses some associated nausea, breast pain, and fatigue but denies vomiting or diarrhea.   Chest Pain      Home Medications Prior to Admission medications   Medication Sig Start Date End Date Taking? Authorizing Provider  aspirin EC 81 MG tablet Take 1 tablet (81 mg total) by mouth daily. Swallow whole. 09/03/22   Jerene Bears, MD  ibuprofen (ADVIL) 800 MG tablet Take 1 tablet (800 mg total) by mouth every 8 (eight) hours as needed. 06/10/22   Jerene Bears, MD  Prenatal Vit-Fe Fumarate-FA (PRENATAL COMPLETE) 14-0.4 MG TABS Take 1 tablet by mouth daily. Patient not taking: Reported on 07/10/2022 03/04/21   Shon Baton, MD  progesterone (PROMETRIUM) 200 MG capsule With positive pregnancy test, place one capsule vaginally twice weekly 09/03/22   Jerene Bears, MD      Allergies    Latex and Amoxicillin    Review of Systems   Review of Systems  Cardiovascular:  Positive for chest pain.  All other systems reviewed and are negative.   Physical Exam Updated Vital Signs BP 110/70   Pulse (!) 56   Temp 98.5 F (36.9 C) (Oral)   Resp 17   Ht 5\' 4"  (1.626 m)   Wt 52.2 kg   LMP 09/27/2022   SpO2 100%   BMI 19.75 kg/m  Physical Exam Vitals and nursing note reviewed.  Constitutional:      General: She is not in acute distress.    Appearance: She is well-developed.  HENT:     Head:  Normocephalic and atraumatic.  Eyes:     Conjunctiva/sclera: Conjunctivae normal.  Cardiovascular:     Rate and Rhythm: Normal rate and regular rhythm.     Heart sounds: No murmur heard. Pulmonary:     Effort: Pulmonary effort is normal. No respiratory distress.     Breath sounds: Normal breath sounds. No decreased breath sounds.  Abdominal:     General: Bowel sounds are normal.     Palpations: Abdomen is soft. There is no mass.     Tenderness: There is no abdominal tenderness.  Musculoskeletal:        General: No swelling.     Cervical back: Neck supple.  Skin:    General: Skin is warm and dry.     Capillary Refill: Capillary refill takes less than 2 seconds.  Neurological:     Mental Status: She is alert.  Psychiatric:        Mood and Affect: Mood normal.     ED Results / Procedures / Treatments   Labs (all labs ordered are listed, but only abnormal results are displayed) Labs Reviewed  CBC  PREGNANCY, URINE  COMPREHENSIVE METABOLIC PANEL  LIPASE, BLOOD  URINALYSIS, ROUTINE W REFLEX MICROSCOPIC  TROPONIN I (HIGH SENSITIVITY)    EKG EKG Interpretation  Date/Time:  Monday October 27 2022 11:13:26 EDT Ventricular Rate:  82 PR Interval:  122 QRS Duration: 98 QT Interval:  370 QTC Calculation: 432 R Axis:   90 Text Interpretation: Normal sinus rhythm with sinus arrhythmia Rightward axis Incomplete right bundle branch block Borderline ECG No previous ECGs available Confirmed by Rolan Bucco [phone]) on 10/27/2022 11:58:05 AM  Radiology DG Chest 2 View  Result Date: 10/27/2022 CLINICAL DATA:  Chest pain EXAM: CHEST - 2 VIEW COMPARISON:  None Available. FINDINGS: No consolidation, pneumothorax or effusion. No edema. Normal cardiopericardial silhouette. IMPRESSION: No acute cardiopulmonary disease. Electronically Signed   By: Karen Kays M.D.   On: 10/27/2022 11:40    Procedures Procedures   Medications Ordered in ED Medications - No data to display  ED Course/  Medical Decision Making/ A&P                           Medical Decision Making Amount and/or Complexity of Data Reviewed Labs: ordered. Radiology: ordered.   This patient presents to the ED for concern of chest pain.  Differential diagnosis includes ACS, PE, bronchitis, viral URI, gastroenteritis   Lab Tests:  I Ordered, and personally interpreted labs.  The pertinent results include: CBC, CMP unremarkable, lipase normal, troponin negative, urine pregnancy negative, urinalysis without evidence of infection   Imaging Studies ordered:  I ordered imaging studies including chest x-ray I independently visualized and interpreted imaging which showed no acute cardiopulmonary process I agree with the radiologist interpretation   Problem List / ED Course:  Patient presents emergency department complaints of chest pain.  Reports this pain is in the central area of her sternum without radiation.  Also endorsing some lower abdominal tenderness.  Unable to reproduce this tenderness in her abdomen on examination.  Patient is currently on fertility treatment and is actively trying to conceive and does report that she feels that she may have missed her menstrual cycle by few days.  Will order urine pregnancy for evaluation in conjunction with lab workup. All labs are largely reassuring without any obvious abnormality noted.  Patient is not currently pregnancy based on urine pregnancy testing.  Troponin level is negative indicating low risk of cardiac involvement of symptoms.  Patient's current PERC score is 0 indicating very low risk of a pulmonary embolism.  Informed patient of reassuring lab workup and imaging findings as well as normal EKG.  Patient also denies any fevers, cough, congestion so low concern for any viral URI or pneumonia process.  Advised patient that she is safe to follow-up outpatient with primary care provider/OB/GYN for further evaluation of symptoms.  Encourage patient to return to  the emergency department if she has any acute worsening or decline in her symptoms.  Patient is agreeable to treatment plan and verbalized understanding all return precautions.  Final Clinical Impression(s) / ED Diagnoses Final diagnoses:  Chest wall pain  Lower abdominal pain    Rx / DC Orders ED Discharge Orders     None         Smitty Knudsen, PA-C 10/27/22 1302    Rolan Bucco, MD 10/27/22 1437

## 2022-12-17 ENCOUNTER — Emergency Department (HOSPITAL_BASED_OUTPATIENT_CLINIC_OR_DEPARTMENT_OTHER)
Admission: EM | Admit: 2022-12-17 | Discharge: 2022-12-17 | Disposition: A | Discharge status: Home / Self Care | Payer: MEDICAID | Attending: Emergency Medicine | Admitting: Emergency Medicine

## 2022-12-17 ENCOUNTER — Encounter (HOSPITAL_BASED_OUTPATIENT_CLINIC_OR_DEPARTMENT_OTHER): Payer: Self-pay | Admitting: Emergency Medicine

## 2022-12-17 ENCOUNTER — Other Ambulatory Visit: Payer: Self-pay

## 2022-12-17 ENCOUNTER — Emergency Department (HOSPITAL_BASED_OUTPATIENT_CLINIC_OR_DEPARTMENT_OTHER): Payer: MEDICAID

## 2022-12-17 DIAGNOSIS — R000 Tachycardia, unspecified: Secondary | ICD-10-CM | POA: Insufficient documentation

## 2022-12-17 DIAGNOSIS — R102 Pelvic and perineal pain: Secondary | ICD-10-CM | POA: Diagnosis not present

## 2022-12-17 DIAGNOSIS — Z7982 Long term (current) use of aspirin: Secondary | ICD-10-CM | POA: Diagnosis not present

## 2022-12-17 DIAGNOSIS — N939 Abnormal uterine and vaginal bleeding, unspecified: Secondary | ICD-10-CM | POA: Diagnosis present

## 2022-12-17 LAB — PREGNANCY, URINE: Preg Test, Ur: NEGATIVE

## 2022-12-17 LAB — URINALYSIS, ROUTINE W REFLEX MICROSCOPIC
Bacteria, UA: NONE SEEN
Bilirubin Urine: NEGATIVE
Glucose, UA: NEGATIVE mg/dL
Ketones, ur: NEGATIVE mg/dL
Nitrite: NEGATIVE
RBC / HPF: >50 RBC/hpf (ref 0–5)
Specific Gravity, Urine: 1.027 (ref 1.005–1.030)
pH: 5.5 (ref 5.0–8.0)

## 2022-12-17 LAB — HCG, QUANTITATIVE, PREGNANCY: hCG, Beta Chain, Quant, S: 2 m[IU]/mL (ref <5)

## 2022-12-17 LAB — BASIC METABOLIC PANEL
Anion gap: 12 (ref 5–15)
BUN: 11 mg/dL (ref 6–20)
CO2: 22 mmol/L (ref 22–32)
Calcium: 9.8 mg/dL (ref 8.9–10.3)
Chloride: 105 mmol/L (ref 98–111)
Creatinine, Ser: 0.8 mg/dL (ref 0.44–1.00)
GFR, Estimated: >60 mL/min (ref >60)
Glucose, Bld: 100 mg/dL — ABNORMAL HIGH (ref 70–99)
Potassium: 3.7 mmol/L (ref 3.5–5.1)
Sodium: 139 mmol/L (ref 135–145)

## 2022-12-17 LAB — CBC
HCT: 37.9 % (ref 36.0–46.0)
Hemoglobin: 13.1 g/dL (ref 12.0–15.0)
MCH: 30.8 pg (ref 26.0–34.0)
MCHC: 34.6 g/dL (ref 30.0–36.0)
MCV: 89.2 fL (ref 80.0–100.0)
Platelets: 365 10*3/uL (ref 150–400)
RBC: 4.25 MIL/uL (ref 3.87–5.11)
RDW: 12.9 % (ref 11.5–15.5)
WBC: 6.7 10*3/uL (ref 4.0–10.5)
nRBC: 0 % (ref 0.0–0.2)

## 2022-12-17 MED ORDER — TRAMADOL HCL 50 MG PO TABS: 50.0000 mg | ORAL_TABLET | Freq: Once | ORAL | Status: DC

## 2022-12-17 MED ORDER — MEDROXYPROGESTERONE ACETATE 5 MG PO TABS: 15.0000 mg | ORAL_TABLET | Freq: Three times a day (TID) | ORAL | 0 refills | Status: DC

## 2022-12-17 NOTE — ED Provider Notes (Signed)
[place] EMERGENCY DEPARTMENT AT [facility] Provider Note   CSN: [id number] Arrival date & time: 12/17/22  1127     History  Chief Complaint  Patient presents with   Vaginal Bleeding    Elizabeth Wheeler is a 22 y.o. female.  22 year old female with a history of PCOS who is G3P0 due to miscarriages who presents to the emergency department with pelvic pain and vaginal bleeding.  Says that 3 to 4 days ago she started experiencing some pinkish discharge from her vagina and then 2 days ago started having heavy vaginal bleeding with passage of clots.  Says that yesterday she was going through 2 pads per hour.  Has been feeling mildly short of breath, generally weak, and dizzy.  Also was having pelvic pain that feels similar to a prior miscarriage.  No history of DVT or PE.  Does not smoke cigarettes. No concern for STI.  Says she is taking a multivitamin and attempting to get pregnant.       Home Medications Prior to Admission medications   Medication Sig Start Date End Date Taking? Authorizing Provider  medroxyPROGESTERone (PROVERA) 5 MG tablet Take 3 tablets (15 mg total) by mouth in the morning, at noon, and at bedtime for 7 days. Take 3 times daily for 7 days or until your bleeding stops 12/17/22 12/24/22 Yes Rondel Baton, MD  aspirin EC 81 MG tablet Take 1 tablet (81 mg total) by mouth daily. Swallow whole. 09/03/22   Jerene Bears, MD  ibuprofen (ADVIL) 800 MG tablet Take 1 tablet (800 mg total) by mouth every 8 (eight) hours as needed. 06/10/22   Jerene Bears, MD  Prenatal Vit-Fe Fumarate-FA (PRENATAL COMPLETE) 14-0.4 MG TABS Take 1 tablet by mouth daily. Patient not taking: Reported on 07/10/2022 03/04/21   Horton, Mayer Masker, MD      Allergies    Latex and Amoxicillin    Review of Systems   Review of Systems  Physical Exam Updated Vital Signs BP 113/83   Pulse 82   Temp 98 F (36.7 C) (Oral)   Resp 16   Ht 5\' 4"  (1.626 m)   Wt 49.9 kg   LMP 12/14/2022  (Approximate)   SpO2 100%   BMI 18.88 kg/m  Physical Exam Constitutional:      Appearance: Normal appearance.  HENT:     Mouth/Throat:     Mouth: Mucous membranes are moist.     Pharynx: Oropharynx is clear.  Cardiovascular:     Rate and Rhythm: Regular rhythm. Tachycardia present.  Pulmonary:     Effort: Pulmonary effort is normal.     Breath sounds: Normal breath sounds.  Abdominal:     General: There is no distension.     Palpations: There is no mass.     Tenderness: There is abdominal tenderness (Minimal lower abdomen). There is no guarding.  Genitourinary:    Comments: Chaperoned by RN Claris Che  External genitalia unremarkable. Nor rashes or lesions noted.  Speculum exam with vaginal bleeding.  Vaginal wall mucosa is unremarkable.  Cervix visualized and is unremarkable (open in appearance without any protruding material).  Bimanual exam without cervical motion tenderness, adnexal tenderness or any masses appreciated.  Neurological:     Mental Status: She is alert.     ED Results / Procedures / Treatments   Labs (all labs ordered are listed, but only abnormal results are displayed) Labs Reviewed  URINALYSIS, ROUTINE W REFLEX MICROSCOPIC - Abnormal; Notable for the following components:  Result Value   APPearance HAZY (*)    Hgb urine dipstick LARGE (*)    Protein, ur TRACE (*)    Leukocytes,Ua TRACE (*)    All other components within normal limits  BASIC METABOLIC PANEL - Abnormal; Notable for the following components:   Glucose, Bld 100 (*)    All other components within normal limits  PREGNANCY, URINE  CBC  HCG, QUANTITATIVE, PREGNANCY    EKG None  Radiology US PELVIC COMPLETE W TRANSVAGINAL AND TORSION R/O  Result Date: 12/17/2022 CLINICAL DATA:  Heavy vaginal bleeding EXAM: TRANSABDOMINAL AND TRANSVAGINAL ULTRASOUND OF PELVIS DOPPLER ULTRASOUND OF OVARIES TECHNIQUE: Both transabdominal and transvaginal ultrasound examinations of the pelvis  were performed. Transabdominal technique was performed for global imaging of the pelvis including uterus, ovaries, adnexal regions, and pelvic cul-de-sac. It was necessary to proceed with endovaginal exam following the transabdominal exam to visualize the uterus and endometrium. Color and duplex Doppler ultrasound was utilized to evaluate blood flow to the ovaries. COMPARISON:  Ultrasound sonohysterogram dated 09/03/2022 FINDINGS: Uterus Measurements: 6.7 cm in sagittal dimension. No fibroids or other mass visualized. Endometrium Thickness: 3 mm.  No focal abnormality visualized. Right ovary Measurements: 3.0 x 2.9 x 1.6 cm = volume: 7.1 mL. Normal appearance. No adnexal mass. Left ovary Measurements: 2.8 x 2.3 x 2.1 cm = volume: 7.1 mL. Normal appearance. No adnexal mass. Pulsed Doppler evaluation of both ovaries demonstrates normal low-resistance arterial and venous waveforms. Other findings No abnormal free fluid. IMPRESSION: Normal pelvic ultrasound examination. No evidence of ovarian torsion. Electronically Signed   By: Agustin Cree M.D.   On: 12/17/2022 14:38    Procedures Procedures    Medications Ordered in ED Medications - No data to display  ED Course/ Medical Decision Making/ A&P Clinical Course as of 12/17/22 1542  Wed Dec 17, 2022  1225 Hemoglobin: 13.1 [RP]    Clinical Course User Index [RP] Rondel Baton, MD                                 Medical Decision Making Amount and/or Complexity of Data Reviewed Labs: ordered. Decision-making details documented in ED Course. Radiology: ordered.  Risk Prescription drug management.   Elizabeth Wheeler is a 22 y.o. female with comorbidities that complicate the patient evaluation including history of miscarriages, PCOS, who is G3, P0 who presents to the emergency department with pelvic pain and vaginal bleeding  Initial Ddx:  Threatened miscarriage, ectopic pregnancy, IUP, abnormal uterine bleeding, symptomatic anemia  MDM/Course:   Patient presents emergency department with heavy vaginal bleeding.  Similar symptoms are concerning for possible symptomatic anemia.  On exam has mild abdominal tenderness to palpation.  Her pelvic exam did show a moderate amount of blood but no significant bleeding and no masses palpated.  Her pregnancy test returned as negative.  Transvaginal ultrasound did not show any structural causes of her bleeding.  Her heart rate and vital signs had normalized upon repeat evaluation did not have any heavy bleeding.  She was feeling much better.  Will have her take an was given progestin to take 3 times daily until her bleeding slowed over the next week.  All of her follow-up with her OB/GYN.  Return precautions discussed prior to discharge.  This patient presents to the ED for concern of complaints listed in HPI, this involves an extensive number of treatment options, and is a complaint that carries with it a high  risk of complications and morbidity. Disposition including potential need for admission considered.   Dispo: DC Home. Return precautions discussed including, but not limited to, those listed in the AVS. Allowed pt time to ask questions which were answered fully prior to dc.  Records reviewed Outpatient Clinic Notes The following labs were independently interpreted: CBC and show no acute abnormality I independently reviewed the following imaging with scope of interpretation limited to determining acute life threatening conditions related to emergency care:  Pelvic ultrasound  and agree with the radiologist interpretation with the following exceptions: none I personally reviewed and interpreted cardiac monitoring: normal sinus rhythm  and sinus tachycardia I personally reviewed and interpreted the pt's EKG: see above for interpretation  I have reviewed the patients home medications and made adjustments as needed       Final Clinical Impression(s) / ED Diagnoses Final diagnoses:  Vaginal  bleeding  Pelvic cramping    Rx / DC Orders ED Discharge Orders          Ordered    medroxyPROGESTERone (PROVERA) 5 MG tablet  3 times daily        12/17/22 1343              Rondel Baton, MD 12/17/22 1542

## 2022-12-17 NOTE — ED Triage Notes (Addendum)
Presents from home for heavier than usual menstrual bleeding, increased abd pain, and weakness/dizziness.  No h/o anemia.  HR is 120 in triage.  Pt states she is using 2 pads an hour at this time.   Endorses lower abd and lower back pain. Denies fever.  Tried tylenol PM.

## 2022-12-17 NOTE — Discharge Instructions (Signed)
You were seen for vaginal bleeding in the emergency department.   At home, please Provera 3 times daily until your vaginal bleeding stops.  Take Tylenol and ibuprofen for your abdominal pain  Check your MyChart online for the results of any tests that had not resulted by the time you left the emergency department.   Follow-up with your primary OB/GYN in 2-3 days regarding your visit.    Return immediately to the emergency department if you experience any of the following: Bleeding more than 4 pads in 2 hours consistently, shortness of breath, dizziness, fainting, or any other concerning symptoms.    Thank you for visiting our Emergency Department. It was a pleasure taking care of you today.

## 2022-12-17 NOTE — ED Notes (Signed)
Pt verbalized understanding of d/c instructions, meds, and followup care. Denies questions. VSS, no distress noted. Steady gait to exit with all belongings.  ?

## 2022-12-29 ENCOUNTER — Ambulatory Visit (HOSPITAL_BASED_OUTPATIENT_CLINIC_OR_DEPARTMENT_OTHER): Payer: MEDICAID | Admitting: Obstetrics & Gynecology

## 2022-12-29 ENCOUNTER — Encounter (HOSPITAL_BASED_OUTPATIENT_CLINIC_OR_DEPARTMENT_OTHER): Payer: Self-pay | Admitting: Obstetrics & Gynecology

## 2022-12-29 VITALS — BP 128/77 | HR 69 | Ht 64.0 in | Wt 111.2 lb

## 2022-12-29 DIAGNOSIS — N921 Excessive and frequent menstruation with irregular cycle: Secondary | ICD-10-CM

## 2022-12-29 DIAGNOSIS — N96 Recurrent pregnancy loss: Secondary | ICD-10-CM

## 2022-12-29 MED ORDER — NORETHINDRONE ACETATE 5 MG PO TABS: ORAL_TABLET | ORAL | 1 refills | Status: AC

## 2022-12-29 NOTE — Progress Notes (Signed)
GYNECOLOGY  VISIT  CC:   ER follow up  HPI: 22 y.o. G3P0030 Significant Other Black or African American female here for follow up after going to the ER due to heavy bleeding.  This was on 8/14.  She was given provera TID to help with bleeding.  She thought this was birth control and decided not to take it.  Hb was normal in the ER.  Bleeding stopped after about 5 days.  States she was nervous about the clotting she was having and this is why they went.  She really does not want to be on hormonal therapy if needed.  ER note and lab work reviewed.  U/S was done and was normal as well.  Reviewed images personally.  They are still trying for pregnancy.  His semen analysis was abnormal.  He is on a vitamin regimen.  Will need repeat semen analysis in about 3 months.  Recommended no tobacco or marijuana to improve semen analysis.    Her cycles are not regular.  She has kept tract the past few months and they are as follows:  May 25-29 June none July 7-13 August 12-16  Discussed ovulation induction agent for pt but would recommend using this once we know how her significant other's results are in early October.  She is comfortable with this plan.  Past Medical History:  Diagnosis Date   Cysts of both ovaries    Depression    History of recurrent miscarriages    PCOS (polycystic ovarian syndrome)     MEDS:   Current Outpatient Medications on File Prior to Visit  Medication Sig Dispense Refill   aspirin EC 81 MG tablet Take 1 tablet (81 mg total) by mouth daily. Swallow whole. 30 tablet 12   ibuprofen (ADVIL) 800 MG tablet Take 1 tablet (800 mg total) by mouth every 8 (eight) hours as needed. 30 tablet 0   Prenatal Vit-Fe Fumarate-FA (PRENATAL COMPLETE) 14-0.4 MG TABS Take 1 tablet by mouth daily. 60 tablet 3   Pyridoxine HCl (VITAMIN B-6 PO) Take by mouth.     VITAMIN D, CHOLECALCIFEROL, PO Take by mouth.     medroxyPROGESTERone (PROVERA) 5 MG tablet Take 3 tablets (15 mg total) by mouth in  the morning, at noon, and at bedtime for 7 days. Take 3 times daily for 7 days or until your bleeding stops 63 tablet 0   No current facility-administered medications on file prior to visit.    ALLERGIES: Latex and Amoxicillin  SH:  single, non smoker  Review of Systems  Constitutional: Negative.   Genitourinary: Negative.     PHYSICAL EXAMINATION:    BP 128/77 (BP Location: Right Arm, Patient Position: Sitting, Cuff Size: Normal)   Pulse 69   Ht 5\' 4"  (1.626 m) Comment: Reported  Wt 111 lb 3.2 oz (50.4 kg)   LMP 12/14/2022 (Approximate)   BMI 19.09 kg/m     Physical Exam Constitutional:      Appearance: Normal appearance.  Cardiovascular:     Rate and Rhythm: Regular rhythm.  Pulmonary:     Effort: Pulmonary effort is normal.  Neurological:     General: No focal deficit present.     Mental Status: She is alert.  Psychiatric:        Mood and Affect: Mood normal.    Assessment/Plan: 1. Menorrhagia with irregular cycle - discussed with pt treatment options.  After discussion, she is going to use progesterone only if has a heavy cycle in the future -  norethindrone (AYGESTIN) 5 MG tablet; With next heavy menstrual bleeding, take 1 tablet bid until bleeding improves  Dispense: 30 tablet; Refill: 1  2. History of recurrent miscarriages - advised pt to stop the baby asa now as this may be contributing to bleeding.  She will start once she has a positive UPT.  Will also start progesterone with next UPT - significant other will repeat semen analysis in October.  Kit given today.  She will call when needs order sent - continue PNV

## 2023-01-25 ENCOUNTER — Encounter (HOSPITAL_BASED_OUTPATIENT_CLINIC_OR_DEPARTMENT_OTHER): Payer: Self-pay

## 2023-01-25 ENCOUNTER — Emergency Department (HOSPITAL_BASED_OUTPATIENT_CLINIC_OR_DEPARTMENT_OTHER)
Admission: EM | Admit: 2023-01-25 | Discharge: 2023-01-25 | Disposition: A | Discharge status: Home / Self Care | Payer: MEDICAID | Attending: Emergency Medicine | Admitting: Emergency Medicine

## 2023-01-25 ENCOUNTER — Other Ambulatory Visit: Payer: Self-pay

## 2023-01-25 DIAGNOSIS — R109 Unspecified abdominal pain: Secondary | ICD-10-CM | POA: Insufficient documentation

## 2023-01-25 DIAGNOSIS — R42 Dizziness and giddiness: Secondary | ICD-10-CM | POA: Diagnosis not present

## 2023-01-25 DIAGNOSIS — Z7982 Long term (current) use of aspirin: Secondary | ICD-10-CM | POA: Diagnosis not present

## 2023-01-25 DIAGNOSIS — Z91040 Latex allergy status: Secondary | ICD-10-CM | POA: Diagnosis not present

## 2023-01-25 DIAGNOSIS — R112 Nausea with vomiting, unspecified: Secondary | ICD-10-CM | POA: Diagnosis present

## 2023-01-25 DIAGNOSIS — R197 Diarrhea, unspecified: Secondary | ICD-10-CM | POA: Insufficient documentation

## 2023-01-25 LAB — COMPREHENSIVE METABOLIC PANEL
ALT: 25 U/L (ref 0–44)
AST: 22 U/L (ref 15–41)
Albumin: 4.5 g/dL (ref 3.5–5.0)
Alkaline Phosphatase: 37 U/L — ABNORMAL LOW (ref 38–126)
Anion gap: 11 (ref 5–15)
BUN: 8 mg/dL (ref 6–20)
CO2: 21 mmol/L — ABNORMAL LOW (ref 22–32)
Calcium: 9.4 mg/dL (ref 8.9–10.3)
Chloride: 107 mmol/L (ref 98–111)
Creatinine, Ser: 0.67 mg/dL (ref 0.44–1.00)
GFR, Estimated: >60 mL/min (ref >60)
Glucose, Bld: 83 mg/dL (ref 70–99)
Potassium: 3.5 mmol/L (ref 3.5–5.1)
Sodium: 139 mmol/L (ref 135–145)
Total Bilirubin: 1.3 mg/dL — ABNORMAL HIGH (ref 0.3–1.2)
Total Protein: 7.3 g/dL (ref 6.5–8.1)

## 2023-01-25 LAB — URINALYSIS, ROUTINE W REFLEX MICROSCOPIC
Bacteria, UA: NONE SEEN
Bilirubin Urine: NEGATIVE
Glucose, UA: NEGATIVE mg/dL
Ketones, ur: 15 mg/dL — AB
Leukocytes,Ua: NEGATIVE
Nitrite: NEGATIVE
Specific Gravity, Urine: 1.031 — ABNORMAL HIGH (ref 1.005–1.030)
pH: 6 (ref 5.0–8.0)

## 2023-01-25 LAB — CBC
HCT: 32.2 % — ABNORMAL LOW (ref 36.0–46.0)
Hemoglobin: 11.4 g/dL — ABNORMAL LOW (ref 12.0–15.0)
MCH: 30.8 pg (ref 26.0–34.0)
MCHC: 35.4 g/dL (ref 30.0–36.0)
MCV: 87 fL (ref 80.0–100.0)
Platelets: 344 10*3/uL (ref 150–400)
RBC: 3.7 MIL/uL — ABNORMAL LOW (ref 3.87–5.11)
RDW: 12.1 % (ref 11.5–15.5)
WBC: 4.1 10*3/uL (ref 4.0–10.5)
nRBC: 0 % (ref 0.0–0.2)

## 2023-01-25 LAB — PREGNANCY, URINE: Preg Test, Ur: NEGATIVE

## 2023-01-25 LAB — LIPASE, BLOOD: Lipase: 17 U/L (ref 11–51)

## 2023-01-25 MED ORDER — ONDANSETRON HCL 4 MG/2ML IJ SOLN
4.0000 mg | Freq: Once | INTRAMUSCULAR | Status: DC | PRN
  Filled 2023-01-25: qty 2

## 2023-01-25 MED ORDER — ONDANSETRON 4 MG PO TBDP: 4.0000 mg | ORAL_TABLET | Freq: Three times a day (TID) | ORAL | 0 refills | Status: DC | PRN

## 2023-01-25 MED ORDER — LACTATED RINGERS IV BOLUS
1000.0000 mL | Freq: Once | INTRAVENOUS | Status: AC
  Administered 2023-01-25: 1000 mL via INTRAVENOUS

## 2023-01-25 NOTE — ED Triage Notes (Signed)
Pt presents with a 2 day hx of lower abd pain with N/V/D.

## 2023-01-25 NOTE — ED Provider Notes (Signed)
[place] EMERGENCY DEPARTMENT AT [facility] Provider Note   CSN: [id number] Arrival date & time: 01/25/23  1221     History {Add pertinent medical, surgical, social history, OB history to HPI:1} Chief Complaint  Patient presents with   Abdominal Pain    Elizabeth Wheeler is a 22 y.o. female.  HPI      22yo female G11P0030 with history of menorrhagia with an irregular cycle, recurrent miscarriages who presents with concern for lower abdominal pain, nausea, vomiting and diarrhea.  3 days not feeling well, for 24 hours diarrhea lots x4, not black or stool, no recent abx, no recent travel.  No known sick contacts. nausea, vomited Friday thought it was from menses. felt lightheaded, continued to feel like going to have diarrhea.  Feeling dehydrated Had first day at new job and was feeling lightheaded, not sure if from menses too because of menorrhagia hx.  Feeling fatigue, no chest pain.  No fever, has felt hot spells and cold chills. Abdominal pain lower abdomen, radiates to the back some, sharp and dull pain, mixture of cramping too, doesn't feel like menses cramping Appetite has been low No dysuria Menses going off now Headache started slowly 3 days ago   She was seen August 14 and given Provera 3 times daily to help with bleeding, she thought it was birth control and decided not to take it.  Her hemoglobin was normal at that time.  Her bleeding stopped after 5 days.  She is nervous about the clotting she was having.  She reported of her OB she did not want to be on hormonal therapy  Past Medical History:  Diagnosis Date   Cysts of both ovaries    Depression    History of recurrent miscarriages    PCOS (polycystic ovarian syndrome)     Past Surgical History:  Procedure Laterality Date   DILATION AND EVACUATION N/A 06/10/2022   Procedure: DILATATION AND EVACUATION;  Surgeon: Jerene Bears, MD;  Location: [facility] [place];  Service: Gynecology;   Laterality: N/A;    Home Medications Prior to Admission medications   Medication Sig Start Date End Date Taking? Authorizing Provider  aspirin EC 81 MG tablet Take 1 tablet (81 mg total) by mouth daily. Swallow whole. 09/03/22   Jerene Bears, MD  ibuprofen (ADVIL) 800 MG tablet Take 1 tablet (800 mg total) by mouth every 8 (eight) hours as needed. 06/10/22   Jerene Bears, MD  norethindrone (AYGESTIN) 5 MG tablet With next heavy menstrual bleeding, take 1 tablet bid until bleeding improves 12/29/22   Jerene Bears, MD  Prenatal Vit-Fe Fumarate-FA (PRENATAL COMPLETE) 14-0.4 MG TABS Take 1 tablet by mouth daily. 03/04/21   Horton, Mayer Masker, MD  Pyridoxine HCl (VITAMIN B-6 PO) Take by mouth.    [provider]  VITAMIN D, CHOLECALCIFEROL, PO Take by mouth.    [provider]      Allergies    Latex and Amoxicillin    Review of Systems   Review of Systems  Physical Exam Updated Vital Signs BP 133/82 (BP Location: Right Arm)   Pulse (!) 147   Temp 98.2 F (36.8 C) (Oral)   Resp 20   Ht 5\' 5"  (1.651 m)   Wt 51.3 kg   LMP 01/19/2023   SpO2 100%   BMI 18.80 kg/m  Physical Exam  ED Results / Procedures / Treatments   Labs (all labs ordered are listed, but only abnormal results are displayed)  Labs Reviewed  LIPASE, BLOOD  COMPREHENSIVE METABOLIC PANEL  CBC  URINALYSIS, ROUTINE W REFLEX MICROSCOPIC  PREGNANCY, URINE    EKG None  Radiology No results found.  Procedures Procedures  {Document cardiac monitor, telemetry assessment procedure when appropriate:1}  Medications Ordered in ED Medications  ondansetron (ZOFRAN) injection 4 mg (has no administration in time range)    ED Course/ Medical Decision Making/ A&P   {   Click here for ABCD2, HEART and other calculatorsREFRESH Note before signing :1}                              Medical Decision Making Amount and/or Complexity of Data Reviewed Labs: ordered.  Risk Prescription drug  management.   ***  {Document critical care time when appropriate:1} {Document review of labs and clinical decision tools ie heart score, Chads2Vasc2 etc:1}  {Document your independent review of radiology images, and any outside records:1} {Document your discussion with family members, caretakers, and with consultants:1} {Document social determinants of health affecting pt's care:1} {Document your decision making why or why not admission, treatments were needed:1} Final Clinical Impression(s) / ED Diagnoses Final diagnoses:  None    Rx / DC Orders ED Discharge Orders     None

## 2023-04-08 ENCOUNTER — Other Ambulatory Visit: Payer: Self-pay

## 2023-04-08 ENCOUNTER — Emergency Department (HOSPITAL_BASED_OUTPATIENT_CLINIC_OR_DEPARTMENT_OTHER)
Admission: EM | Admit: 2023-04-08 | Discharge: 2023-04-08 | Disposition: A | Discharge status: Home / Self Care | Payer: MEDICAID | Attending: Emergency Medicine | Admitting: Emergency Medicine

## 2023-04-08 DIAGNOSIS — R102 Pelvic and perineal pain: Secondary | ICD-10-CM

## 2023-04-08 DIAGNOSIS — Z7982 Long term (current) use of aspirin: Secondary | ICD-10-CM | POA: Diagnosis not present

## 2023-04-08 DIAGNOSIS — R1030 Lower abdominal pain, unspecified: Secondary | ICD-10-CM | POA: Insufficient documentation

## 2023-04-08 DIAGNOSIS — Z91040 Latex allergy status: Secondary | ICD-10-CM | POA: Insufficient documentation

## 2023-04-08 DIAGNOSIS — R109 Unspecified abdominal pain: Secondary | ICD-10-CM

## 2023-04-08 LAB — URINALYSIS, ROUTINE W REFLEX MICROSCOPIC
Bacteria, UA: NONE SEEN
Bilirubin Urine: NEGATIVE
Glucose, UA: NEGATIVE mg/dL
Hgb urine dipstick: NEGATIVE
Ketones, ur: NEGATIVE mg/dL
Leukocytes,Ua: NEGATIVE
Nitrite: NEGATIVE
Protein, ur: NEGATIVE mg/dL
Specific Gravity, Urine: 1.019 (ref 1.005–1.030)
pH: 8 (ref 5.0–8.0)

## 2023-04-08 LAB — CBC WITH DIFFERENTIAL/PLATELET
Abs Immature Granulocytes: 0.02 10*3/uL (ref 0.00–0.07)
Basophils Absolute: 0 10*3/uL (ref 0.0–0.1)
Basophils Relative: 1 %
Eosinophils Absolute: 0.1 10*3/uL (ref 0.0–0.5)
Eosinophils Relative: 1 %
HCT: 34.2 % — ABNORMAL LOW (ref 36.0–46.0)
Hemoglobin: 11.6 g/dL — ABNORMAL LOW (ref 12.0–15.0)
Immature Granulocytes: 0 %
Lymphocytes Relative: 37 %
Lymphs Abs: 3.2 10*3/uL (ref 0.7–4.0)
MCH: 30.4 pg (ref 26.0–34.0)
MCHC: 33.9 g/dL (ref 30.0–36.0)
MCV: 89.8 fL (ref 80.0–100.0)
Monocytes Absolute: 0.7 10*3/uL (ref 0.1–1.0)
Monocytes Relative: 8 %
Neutro Abs: 4.6 10*3/uL (ref 1.7–7.7)
Neutrophils Relative %: 53 %
Platelets: 345 10*3/uL (ref 150–400)
RBC: 3.81 MIL/uL — ABNORMAL LOW (ref 3.87–5.11)
RDW: 13 % (ref 11.5–15.5)
WBC: 8.6 10*3/uL (ref 4.0–10.5)
nRBC: 0 % (ref 0.0–0.2)

## 2023-04-08 LAB — PREGNANCY, URINE: Preg Test, Ur: NEGATIVE

## 2023-04-08 LAB — BASIC METABOLIC PANEL
Anion gap: 8 (ref 5–15)
BUN: 10 mg/dL (ref 6–20)
CO2: 25 mmol/L (ref 22–32)
Calcium: 9.3 mg/dL (ref 8.9–10.3)
Chloride: 105 mmol/L (ref 98–111)
Creatinine, Ser: 0.63 mg/dL (ref 0.44–1.00)
GFR, Estimated: >60 mL/min (ref >60)
Glucose, Bld: 88 mg/dL (ref 70–99)
Potassium: 3.8 mmol/L (ref 3.5–5.1)
Sodium: 138 mmol/L (ref 135–145)

## 2023-04-08 LAB — WET PREP, GENITAL
Clue Cells Wet Prep HPF POC: NONE SEEN
Sperm: NONE SEEN
Trich, Wet Prep: NONE SEEN
WBC, Wet Prep HPF POC: <10 (ref <10)
Yeast Wet Prep HPF POC: NONE SEEN

## 2023-04-08 MED ORDER — NAPROXEN 500 MG PO TABS: 500.0000 mg | ORAL_TABLET | Freq: Two times a day (BID) | ORAL | 0 refills | Status: AC

## 2023-04-08 NOTE — ED Provider Notes (Incomplete)
[place] EMERGENCY DEPARTMENT AT [facility] Provider Note   CSN: [id number] Arrival date & time: 04/08/23  1751     History {Add pertinent medical, surgical, social history, OB history to HPI:1} Chief Complaint  Patient presents with  . Flank Pain    Elizabeth Wheeler is a 22 y.o. female with a history of PCOS and recurrent miscarriages who presents to the ED today for pelvic pain.       Home Medications Prior to Admission medications   Medication Sig Start Date End Date Taking? Authorizing Provider  aspirin EC 81 MG tablet Take 1 tablet (81 mg total) by mouth daily. Swallow whole. 09/03/22   Jerene Bears, MD  ibuprofen (ADVIL) 800 MG tablet Take 1 tablet (800 mg total) by mouth every 8 (eight) hours as needed. 06/10/22   Jerene Bears, MD  norethindrone (AYGESTIN) 5 MG tablet With next heavy menstrual bleeding, take 1 tablet bid until bleeding improves 12/29/22   Jerene Bears, MD  ondansetron (ZOFRAN-ODT) 4 MG disintegrating tablet Take 1 tablet (4 mg total) by mouth every 8 (eight) hours as needed for nausea or vomiting. 01/25/23   Alvira Monday, MD  Prenatal Vit-Fe Fumarate-FA (PRENATAL COMPLETE) 14-0.4 MG TABS Take 1 tablet by mouth daily. 03/04/21   Horton, Mayer Masker, MD  Pyridoxine HCl (VITAMIN B-6 PO) Take by mouth.    [provider]  VITAMIN D, CHOLECALCIFEROL, PO Take by mouth.    [provider]      Allergies    Latex and Amoxicillin    Review of Systems   Review of Systems  Genitourinary:  Positive for pelvic pain.  All other systems reviewed and are negative.   Physical Exam Updated Vital Signs BP 109/78   Pulse 64   Temp 98.1 F (36.7 C)   Resp 18   Ht 5\' 4"  (1.626 m)   Wt 50.3 kg   SpO2 100%   BMI 19.05 kg/m  Physical Exam Vitals and nursing note reviewed. Exam conducted with a chaperone present.  Constitutional:      Appearance: Normal appearance.  HENT:     Head: Normocephalic and atraumatic.     Mouth/Throat:      Mouth: Mucous membranes are moist.  Eyes:     Conjunctiva/sclera: Conjunctivae normal.     Pupils: Pupils are equal, round, and reactive to light.  Cardiovascular:     Rate and Rhythm: Normal rate and regular rhythm.     Pulses: Normal pulses.  Pulmonary:     Effort: Pulmonary effort is normal.     Breath sounds: Normal breath sounds.  Abdominal:     Palpations: Abdomen is soft.     Tenderness: There is abdominal tenderness. There is no right CVA tenderness or left CVA tenderness.     Comments: Suprapubic tenderness to palpation  Genitourinary:    General: Normal vulva.     Vagina: No vaginal discharge.  Skin:    General: Skin is warm and dry.     Findings: No rash.  Neurological:     General: No focal deficit present.     Mental Status: She is alert.  Psychiatric:        Mood and Affect: Mood normal.        Behavior: Behavior normal.    ED Results / Procedures / Treatments   Labs (all labs ordered are listed, but only abnormal results are displayed) Labs Reviewed  WET PREP, GENITAL  URINALYSIS, ROUTINE W REFLEX MICROSCOPIC  PREGNANCY, URINE  BASIC METABOLIC PANEL  CBC WITH DIFFERENTIAL/PLATELET  GC/CHLAMYDIA PROBE AMP ([place]) NOT AT [facility]    EKG None  Radiology No results found.  Procedures Procedures: not indicated. {Document cardiac monitor, telemetry assessment procedure when appropriate:1}  Medications Ordered in ED Medications - No data to display  ED Course/ Medical Decision Making/ A&P   {   Click here for ABCD2, HEART and other calculatorsREFRESH Note before signing :1}                              Medical Decision Making Amount and/or Complexity of Data Reviewed Labs: ordered.   ***  {Document critical care time when appropriate:1} {Document review of labs and clinical decision tools ie heart score, Chads2Vasc2 etc:1}  {Document your independent review of radiology images, and any outside records:1} {Document your discussion with  family members, caretakers, and with consultants:1} {Document social determinants of health affecting pt's care:1} {Document your decision making why or why not admission, treatments were needed:1} Final Clinical Impression(s) / ED Diagnoses Final diagnoses:  None    Rx / DC Orders ED Discharge Orders     None

## 2023-04-08 NOTE — ED Provider Notes (Signed)
  [place] EMERGENCY DEPARTMENT AT [facility] Provider Note   CSN: [id number] Arrival date & time: 04/08/23  1751     History {Add pertinent medical, surgical, social history, OB history to HPI:1} Chief Complaint  Patient presents with  . Flank Pain    Elizabeth Wheeler is a 22 y.o. female.   Flank Pain      Home Medications Prior to Admission medications   Medication Sig Start Date End Date Taking? Authorizing Provider  aspirin EC 81 MG tablet Take 1 tablet (81 mg total) by mouth daily. Swallow whole. 09/03/22   Jerene Bears, MD  ibuprofen (ADVIL) 800 MG tablet Take 1 tablet (800 mg total) by mouth every 8 (eight) hours as needed. 06/10/22   Jerene Bears, MD  norethindrone (AYGESTIN) 5 MG tablet With next heavy menstrual bleeding, take 1 tablet bid until bleeding improves 12/29/22   Jerene Bears, MD  ondansetron (ZOFRAN-ODT) 4 MG disintegrating tablet Take 1 tablet (4 mg total) by mouth every 8 (eight) hours as needed for nausea or vomiting. 01/25/23   Alvira Monday, MD  Prenatal Vit-Fe Fumarate-FA (PRENATAL COMPLETE) 14-0.4 MG TABS Take 1 tablet by mouth daily. 03/04/21   Horton, Mayer Masker, MD  Pyridoxine HCl (VITAMIN B-6 PO) Take by mouth.    [provider]  VITAMIN D, CHOLECALCIFEROL, PO Take by mouth.    [provider]      Allergies    Latex and Amoxicillin    Review of Systems   Review of Systems  Genitourinary:  Positive for flank pain.    Physical Exam Updated Vital Signs BP 109/78   Pulse 64   Temp 98.1 F (36.7 C)   Resp 18   Ht 5\' 4"  (1.626 m)   Wt 50.3 kg   SpO2 100%   BMI 19.05 kg/m  Physical Exam  ED Results / Procedures / Treatments   Labs (all labs ordered are listed, but only abnormal results are displayed) Labs Reviewed  WET PREP, GENITAL  URINALYSIS, ROUTINE W REFLEX MICROSCOPIC  PREGNANCY, URINE  BASIC METABOLIC PANEL  CBC WITH DIFFERENTIAL/PLATELET  GC/CHLAMYDIA PROBE AMP () NOT AT [facility]     EKG None  Radiology No results found.  Procedures Procedures  {Document cardiac monitor, telemetry assessment procedure when appropriate:1}  Medications Ordered in ED Medications - No data to display  ED Course/ Medical Decision Making/ A&P   {   Click here for ABCD2, HEART and other calculatorsREFRESH Note before signing :1}                              Medical Decision Making Amount and/or Complexity of Data Reviewed Labs: ordered.   ***  {Document critical care time when appropriate:1} {Document review of labs and clinical decision tools ie heart score, Chads2Vasc2 etc:1}  {Document your independent review of radiology images, and any outside records:1} {Document your discussion with family members, caretakers, and with consultants:1} {Document social determinants of health affecting pt's care:1} {Document your decision making why or why not admission, treatments were needed:1} Final Clinical Impression(s) / ED Diagnoses Final diagnoses:  None    Rx / DC Orders ED Discharge Orders     None

## 2023-04-08 NOTE — Discharge Instructions (Addendum)
As discussed, your labs are reassuring.  I have sent a prescription of naproxen to your pharmacy you can take this twice a day as needed for pain.  Call your gynecologist and schedule appointment for further evaluation of your symptoms.  Get help right away if: You have sudden pain that is very bad. You have very bad pain and also have any of these symptoms: A fever. Feeling like you may vomit (nauseous). Vomiting. Being very sweaty. You faint.

## 2023-04-08 NOTE — ED Triage Notes (Signed)
Pt POV from home reporting persistent UTI sx. Has been taking meds for 2 weeks, now has pelvic pain and bilateral flank pain.

## 2023-04-10 LAB — GC/CHLAMYDIA PROBE AMP (~~LOC~~) NOT AT ARMC
Chlamydia: NEGATIVE
Comment: NEGATIVE
Comment: NORMAL
Neisseria Gonorrhea: NEGATIVE

## 2023-07-02 ENCOUNTER — Other Ambulatory Visit: Payer: Self-pay

## 2023-07-02 ENCOUNTER — Encounter (HOSPITAL_BASED_OUTPATIENT_CLINIC_OR_DEPARTMENT_OTHER): Payer: Self-pay

## 2023-07-02 DIAGNOSIS — R109 Unspecified abdominal pain: Secondary | ICD-10-CM | POA: Insufficient documentation

## 2023-07-02 DIAGNOSIS — S60562A Insect bite (nonvenomous) of left hand, initial encounter: Secondary | ICD-10-CM | POA: Insufficient documentation

## 2023-07-02 DIAGNOSIS — W57XXXA Bitten or stung by nonvenomous insect and other nonvenomous arthropods, initial encounter: Secondary | ICD-10-CM | POA: Insufficient documentation

## 2023-07-02 DIAGNOSIS — N898 Other specified noninflammatory disorders of vagina: Secondary | ICD-10-CM | POA: Insufficient documentation

## 2023-07-02 DIAGNOSIS — Z5321 Procedure and treatment not carried out due to patient leaving prior to being seen by health care provider: Secondary | ICD-10-CM | POA: Insufficient documentation

## 2023-07-02 LAB — URINALYSIS, ROUTINE W REFLEX MICROSCOPIC
Bacteria, UA: NONE SEEN
Bilirubin Urine: NEGATIVE
Glucose, UA: NEGATIVE mg/dL
Hgb urine dipstick: NEGATIVE
Ketones, ur: NEGATIVE mg/dL
Leukocytes,Ua: NEGATIVE
Nitrite: NEGATIVE
Protein, ur: NEGATIVE mg/dL
Specific Gravity, Urine: 1.019 (ref 1.005–1.030)
pH: 6.5 (ref 5.0–8.0)

## 2023-07-02 LAB — COMPREHENSIVE METABOLIC PANEL
ALT: 10 U/L (ref 0–44)
AST: 17 U/L (ref 15–41)
Albumin: 4.8 g/dL (ref 3.5–5.0)
Alkaline Phosphatase: 46 U/L (ref 38–126)
Anion gap: 10 (ref 5–15)
BUN: 14 mg/dL (ref 6–20)
CO2: 25 mmol/L (ref 22–32)
Calcium: 10.4 mg/dL — ABNORMAL HIGH (ref 8.9–10.3)
Chloride: 102 mmol/L (ref 98–111)
Creatinine, Ser: 0.84 mg/dL (ref 0.44–1.00)
GFR, Estimated: >60 mL/min (ref >60)
Glucose, Bld: 88 mg/dL (ref 70–99)
Potassium: 3.9 mmol/L (ref 3.5–5.1)
Sodium: 137 mmol/L (ref 135–145)
Total Bilirubin: 0.4 mg/dL (ref 0.0–1.2)
Total Protein: 8 g/dL (ref 6.5–8.1)

## 2023-07-02 LAB — CBC
HCT: 37.2 % (ref 36.0–46.0)
Hemoglobin: 12.8 g/dL (ref 12.0–15.0)
MCH: 30.8 pg (ref 26.0–34.0)
MCHC: 34.4 g/dL (ref 30.0–36.0)
MCV: 89.4 fL (ref 80.0–100.0)
Platelets: 431 10*3/uL — ABNORMAL HIGH (ref 150–400)
RBC: 4.16 MIL/uL (ref 3.87–5.11)
RDW: 13.9 % (ref 11.5–15.5)
WBC: 12.1 10*3/uL — ABNORMAL HIGH (ref 4.0–10.5)
nRBC: 0 % (ref 0.0–0.2)

## 2023-07-02 LAB — PREGNANCY, URINE: Preg Test, Ur: NEGATIVE

## 2023-07-02 LAB — LIPASE, BLOOD: Lipase: 46 U/L (ref 11–51)

## 2023-07-02 NOTE — ED Triage Notes (Addendum)
 Pt reports she is here today due to lower left abd pain. Pt reports the pain radiates to her pelvic and back area. Pt reports white vaginal discharge. Pt reports lower left flank pain. Pt reports she was recently dx with UTI & BV. Pt reports she completed metronidazole an nitrofurantoin. Pt reports unknown is preg. Pt reports she also would like to get seen about an insect bite to the left hand. Pt reports it happen x1 week ago.

## 2023-07-03 ENCOUNTER — Emergency Department (HOSPITAL_BASED_OUTPATIENT_CLINIC_OR_DEPARTMENT_OTHER)
Admission: EM | Admit: 2023-07-03 | Discharge: 2023-07-03 | Discharge status: Against Medical Advice | Payer: MEDICAID | Attending: Emergency Medicine | Admitting: Emergency Medicine

## 2023-07-03 NOTE — ED Notes (Signed)
 Multiple calls for room placement. Eloped from post triage waiting.

## 2023-10-08 ENCOUNTER — Encounter (HOSPITAL_BASED_OUTPATIENT_CLINIC_OR_DEPARTMENT_OTHER): Payer: Self-pay

## 2023-10-08 ENCOUNTER — Other Ambulatory Visit: Payer: Self-pay

## 2023-10-08 ENCOUNTER — Emergency Department (HOSPITAL_BASED_OUTPATIENT_CLINIC_OR_DEPARTMENT_OTHER)
Admission: EM | Admit: 2023-10-08 | Discharge: 2023-10-08 | Discharge status: Against Medical Advice | Payer: MEDICAID | Attending: Emergency Medicine | Admitting: Emergency Medicine

## 2023-10-08 DIAGNOSIS — R110 Nausea: Secondary | ICD-10-CM | POA: Insufficient documentation

## 2023-10-08 DIAGNOSIS — Z5321 Procedure and treatment not carried out due to patient leaving prior to being seen by health care provider: Secondary | ICD-10-CM | POA: Diagnosis not present

## 2023-10-08 DIAGNOSIS — R42 Dizziness and giddiness: Secondary | ICD-10-CM | POA: Insufficient documentation

## 2023-10-08 DIAGNOSIS — N939 Abnormal uterine and vaginal bleeding, unspecified: Secondary | ICD-10-CM | POA: Diagnosis present

## 2023-10-08 LAB — URINALYSIS, ROUTINE W REFLEX MICROSCOPIC
Bacteria, UA: NONE SEEN
Bilirubin Urine: NEGATIVE
Glucose, UA: NEGATIVE mg/dL
Ketones, ur: NEGATIVE mg/dL
Nitrite: NEGATIVE
RBC / HPF: >50 RBC/hpf (ref 0–5)
Specific Gravity, Urine: 1.028 (ref 1.005–1.030)
pH: 6.5 (ref 5.0–8.0)

## 2023-10-08 LAB — COMPREHENSIVE METABOLIC PANEL WITH GFR
ALT: 9 U/L (ref 0–44)
AST: 21 U/L (ref 15–41)
Albumin: 4.6 g/dL (ref 3.5–5.0)
Alkaline Phosphatase: 51 U/L (ref 38–126)
Anion gap: 16 — ABNORMAL HIGH (ref 5–15)
BUN: 9 mg/dL (ref 6–20)
CO2: 20 mmol/L — ABNORMAL LOW (ref 22–32)
Calcium: 10 mg/dL (ref 8.9–10.3)
Chloride: 103 mmol/L (ref 98–111)
Creatinine, Ser: 0.65 mg/dL (ref 0.44–1.00)
GFR, Estimated: >60 mL/min (ref >60)
Glucose, Bld: 91 mg/dL (ref 70–99)
Potassium: 3.9 mmol/L (ref 3.5–5.1)
Sodium: 140 mmol/L (ref 135–145)
Total Bilirubin: 0.3 mg/dL (ref 0.0–1.2)
Total Protein: 8.1 g/dL (ref 6.5–8.1)

## 2023-10-08 LAB — CBC
HCT: 35.6 % — ABNORMAL LOW (ref 36.0–46.0)
Hemoglobin: 11.8 g/dL — ABNORMAL LOW (ref 12.0–15.0)
MCH: 30.3 pg (ref 26.0–34.0)
MCHC: 33.1 g/dL (ref 30.0–36.0)
MCV: 91.3 fL (ref 80.0–100.0)
Platelets: 423 10*3/uL — ABNORMAL HIGH (ref 150–400)
RBC: 3.9 MIL/uL (ref 3.87–5.11)
RDW: 13.4 % (ref 11.5–15.5)
WBC: 12.8 10*3/uL — ABNORMAL HIGH (ref 4.0–10.5)
nRBC: 0 % (ref 0.0–0.2)

## 2023-10-08 LAB — PREGNANCY, URINE: Preg Test, Ur: NEGATIVE

## 2023-10-08 NOTE — ED Triage Notes (Signed)
 Pt c/o vaginal bleeding x3 days, passing heavy clots. LMP 5/19. Pt advises associated abd pain, nausea from pain, "a little" lightheaded." Seen at [facility] for same yesterday, concern for continued bleeding/ pain

## 2023-10-17 ENCOUNTER — Encounter: Payer: Self-pay | Admitting: Emergency Medicine

## 2023-10-17 ENCOUNTER — Emergency Department: Payer: MEDICAID

## 2023-10-17 ENCOUNTER — Emergency Department
Admission: EM | Admit: 2023-10-17 | Discharge: 2023-10-17 | Disposition: A | Discharge status: Home / Self Care | Payer: MEDICAID | Attending: Emergency Medicine | Admitting: Emergency Medicine

## 2023-10-17 ENCOUNTER — Other Ambulatory Visit: Payer: Self-pay

## 2023-10-17 DIAGNOSIS — R102 Pelvic and perineal pain: Secondary | ICD-10-CM | POA: Insufficient documentation

## 2023-10-17 DIAGNOSIS — D72829 Elevated white blood cell count, unspecified: Secondary | ICD-10-CM | POA: Diagnosis not present

## 2023-10-17 LAB — CBC WITH DIFFERENTIAL/PLATELET
Abs Immature Granulocytes: 0.04 10*3/uL (ref 0.00–0.07)
Basophils Absolute: 0.1 10*3/uL (ref 0.0–0.1)
Basophils Relative: 0 %
Eosinophils Absolute: 0.1 10*3/uL (ref 0.0–0.5)
Eosinophils Relative: 1 %
HCT: 31.2 % — ABNORMAL LOW (ref 36.0–46.0)
Hemoglobin: 10.5 g/dL — ABNORMAL LOW (ref 12.0–15.0)
Immature Granulocytes: 0 %
Lymphocytes Relative: 21 %
Lymphs Abs: 2.8 10*3/uL (ref 0.7–4.0)
MCH: 30.7 pg (ref 26.0–34.0)
MCHC: 33.7 g/dL (ref 30.0–36.0)
MCV: 91.2 fL (ref 80.0–100.0)
Monocytes Absolute: 0.9 10*3/uL (ref 0.1–1.0)
Monocytes Relative: 7 %
Neutro Abs: 9.1 10*3/uL — ABNORMAL HIGH (ref 1.7–7.7)
Neutrophils Relative %: 71 %
Platelets: 446 10*3/uL — ABNORMAL HIGH (ref 150–400)
RBC: 3.42 MIL/uL — ABNORMAL LOW (ref 3.87–5.11)
RDW: 13.3 % (ref 11.5–15.5)
WBC: 13.1 10*3/uL — ABNORMAL HIGH (ref 4.0–10.5)
nRBC: 0 % (ref 0.0–0.2)

## 2023-10-17 LAB — URINALYSIS, ROUTINE W REFLEX MICROSCOPIC
Bilirubin Urine: NEGATIVE
Glucose, UA: NEGATIVE mg/dL
Hgb urine dipstick: NEGATIVE
Ketones, ur: 5 mg/dL — AB
Nitrite: NEGATIVE
Protein, ur: NEGATIVE mg/dL
Specific Gravity, Urine: 1.026 (ref 1.005–1.030)
pH: 6 (ref 5.0–8.0)

## 2023-10-17 LAB — BASIC METABOLIC PANEL WITH GFR
Anion gap: 9 (ref 5–15)
BUN: 13 mg/dL (ref 6–20)
CO2: 23 mmol/L (ref 22–32)
Calcium: 9.5 mg/dL (ref 8.9–10.3)
Chloride: 105 mmol/L (ref 98–111)
Creatinine, Ser: 0.95 mg/dL (ref 0.44–1.00)
GFR, Estimated: >60 mL/min (ref >60)
Glucose, Bld: 112 mg/dL — ABNORMAL HIGH (ref 70–99)
Potassium: 3.7 mmol/L (ref 3.5–5.1)
Sodium: 137 mmol/L (ref 135–145)

## 2023-10-17 LAB — PREGNANCY, URINE: Preg Test, Ur: NEGATIVE

## 2023-10-17 NOTE — ED Provider Notes (Signed)
 [facility] Provider Note    Event Date/Time   First MD Initiated Contact with Patient 10/17/23 2137     (approximate)   History   Pelvic Pain   HPI  Elizabeth Wheeler is a 23 y.o. 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  Patient states she had really heavy bleeding and was seen here on 5 June and left without being seen because of the weight.  Did not get a ultrasound done at that time.  Continues to have pain but the bleeding has stopped.  States she had very large clots at that time.      Physical Exam   Triage Vital Signs: ED Triage Vitals  Encounter Vitals Group     BP 10/17/23 2054 119/73     Girls Systolic BP Percentile --      Girls Diastolic BP Percentile --      Boys Systolic BP Percentile --      Boys Diastolic BP Percentile --      Pulse Rate 10/17/23 2054 91     Resp 10/17/23 2054 18     Temp 10/17/23 2054 98.6 F (37 C)     Temp Source 10/17/23 2054 Oral     SpO2 10/17/23 2054 100 %     Weight 10/17/23 2054 120 lb (54.4 kg)     Height 10/17/23 2054 5' 4 (1.626 m)     Head Circumference --      Peak Flow --      Pain Score 10/17/23 2147 3     Pain Loc --      Pain Education --      Exclude from Growth Chart --     Most recent vital signs: Vitals:   10/17/23 2054  BP: 119/73  Pulse: 91  Resp: 18  Temp: 98.6 F (37 C)  SpO2: 100%     General: Awake, no distress.   CV:  Good peripheral perfusion. Resp:  Normal effort.  Abd:  No distention.  Tender in the left lower quadrant near the ovary Other:      ED Results / Procedures / Treatments   Labs (all labs ordered are listed, but only abnormal results are displayed) Labs Reviewed  URINALYSIS, ROUTINE W REFLEX MICROSCOPIC - Abnormal; Notable for the following components:      Result Value   Color, Urine YELLOW (*)    APPearance HAZY (*)    Ketones, ur 5 (*)     Leukocytes,Ua SMALL (*)    Bacteria, UA RARE (*)    All other components within normal limits  BASIC METABOLIC PANEL WITH GFR - Abnormal; Notable for the following components:   Glucose, Bld 112 (*)    All other components within normal limits  CBC WITH DIFFERENTIAL/PLATELET - Abnormal; Notable for the following components:   WBC 13.1 (*)    RBC 3.42 (*)    Hemoglobin 10.5 (*)    HCT 31.2 (*)    Platelets 446 (*)    Neutro Abs 9.1 (*)    All other components within normal limits  PREGNANCY, URINE     EKG     RADIOLOGY Ultrasound pelvis    PROCEDURES:   Procedures  Critical Care:  no Chief Complaint  Patient presents with   Pelvic Pain      MEDICATIONS ORDERED IN ED: Medications - No data to display   IMPRESSION / MDM /  ASSESSMENT AND PLAN / ED COURSE  I reviewed the triage vital signs and the nursing notes.                              Differential diagnosis includes, but is not limited to, ovarian cyst, ruptured ovarian cyst, torsion, PID, UTI, kidney stone  Patient's presentation is most consistent with acute illness / injury with system symptoms.    Medications givennone  Patient CBC has elevated WBC of 13.1, urinalysis and basic metabolic panel are reassuring, pregnancy test negative  Ultrasound of the pelvis complete   Ultrasound of the pelvis independently reviewed interpreted by me by reading radiologist report as being negative for acute abnormality  The patient is feeling better than she was when she came in on June 5, therefore do not feel we need to do CT abdomen pelvis.  She can follow-up with her regular GYN doctor.  She is to call for another appointment.  Return emergency department worsening.  She is in agreement treatment plan.  Discharged stable condition.  She was given a work note as requested   FINAL CLINICAL IMPRESSION(S) / ED DIAGNOSES   Final diagnoses:  Pelvic pain in female     Rx / DC Orders   ED Discharge Orders      None        Note:  This document was prepared using Dragon voice recognition software and may include unintentional dictation errors.    Delsie Figures, PA-C 10/17/23 2313    Ruth Cove, MD 10/17/23 [phone]

## 2023-10-17 NOTE — ED Notes (Signed)
 Pregnancy test is negative

## 2023-10-17 NOTE — ED Triage Notes (Signed)
 Pt states was seen at [facility] 6/5 for pelvic pain and vaginal bleeding. Pt left without being seen due to wait. Pt states was told it was in between bleeding. Pt states was reviewing her bloodwork in my chart and does not understand some of her blood work. Pt states bleeding has resolved but continues to have pelvic pain.

## 2023-10-17 NOTE — Discharge Instructions (Addendum)
 Follow-up with your regular doctor if not improving in 3 to 4 days.  Return to emergency department for worsening.  Take Tylenol  or ibuprofen  for pain as needed.  Follow-up with your GYN. Labs your ultrasound were reassuring today

## 2023-11-30 ENCOUNTER — Ambulatory Visit (HOSPITAL_BASED_OUTPATIENT_CLINIC_OR_DEPARTMENT_OTHER): Payer: MEDICAID | Admitting: Certified Nurse Midwife

## 2024-03-18 ENCOUNTER — Emergency Department
Admission: EM | Admit: 2024-03-18 | Discharge: 2024-03-18 | Disposition: A | Discharge status: Home / Self Care | Payer: MEDICAID | Attending: Emergency Medicine | Admitting: Emergency Medicine

## 2024-03-18 ENCOUNTER — Other Ambulatory Visit: Payer: Self-pay

## 2024-03-18 ENCOUNTER — Emergency Department: Admission: EM | Admit: 2024-03-18 | Discharge: 2024-03-18 | Discharge status: Against Medical Advice | Payer: MEDICAID

## 2024-03-18 DIAGNOSIS — K1329 Other disturbances of oral epithelium, including tongue: Secondary | ICD-10-CM | POA: Diagnosis present

## 2024-03-18 DIAGNOSIS — B370 Candidal stomatitis: Secondary | ICD-10-CM | POA: Diagnosis not present

## 2024-03-18 LAB — CHLAMYDIA/NGC RT PCR (ARMC ONLY)
Chlamydia Tr: DETECTED — AB
N gonorrhoeae: NOT DETECTED

## 2024-03-18 LAB — WET PREP, GENITAL
Clue Cells Wet Prep HPF POC: NONE SEEN
Sperm: NONE SEEN
Trich, Wet Prep: NONE SEEN
WBC, Wet Prep HPF POC: <10 (ref <10)
Yeast Wet Prep HPF POC: NONE SEEN

## 2024-03-18 MED ORDER — NYSTATIN 100000 UNIT/ML MT SUSP: 5.0000 mL | Freq: Four times a day (QID) | OROMUCOSAL | 0 refills | Status: AC

## 2024-03-18 NOTE — Discharge Instructions (Signed)
 You have been diagnosed with oral candidiasis or oral thrush.  Please take 5 mL of Mycostatin in your mouth 4 times daily.  Please check MyChart and check the results of your test.  Please come back if the test are positive to get treatment.  Please call [facility] clinic and make an appointment to establish care in the area.  Please come back to ED or go to the [facility] if you have new symptoms symptoms worsen.

## 2024-03-18 NOTE — ED Provider Notes (Signed)
 [facility] Provider Note    Event Date/Time   First MD Initiated Contact with Patient 03/18/24 1815     (approximate)   History   Mouth Problem    HPI  Elizabeth Wheeler is a 23 y.o. female    with a past medical history of rhinovirus,, with no significant past medical history who presents to the ED complaining of white patches on her tongue. According to the patient, 3 days ago and she noticed white plaques on her tongue.  Patient endorses that she had vaginal candidiasis and then she had oral sex.  Patient denies taking any antibiotics recently.  Patient denies vaginal discharge, abdominal pain.  Patient does not have a PCP, she just moved recently to the area.  Desires to be tested for STDs, no HIV.     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   Depressive disorder 01/19/2018     Physical Exam   Triage Vital Signs: ED Triage Vitals  Encounter Vitals Group     BP 03/18/24 1634 (!) 131/97     Girls Systolic BP Percentile --      Girls Diastolic BP Percentile --      Boys Systolic BP Percentile --      Boys Diastolic BP Percentile --      Pulse Rate 03/18/24 1634 (!) 105     Resp 03/18/24 1634 18     Temp 03/18/24 1634 98.7 F (37.1 C)     Temp Source 03/18/24 1634 Oral     SpO2 03/18/24 1634 100 %     Weight --      Height --      Head Circumference --      Peak Flow --      Pain Score 03/18/24 1633 0     Pain Loc --      Pain Education --      Exclude from Growth Chart --     Most recent vital signs: Vitals:   03/18/24 1634  BP: (!) 131/97  Pulse: (!) 105  Resp: 18  Temp: 98.7 F (37.1 C)  SpO2: 100%     Physical Exam Vitals and nursing note reviewed.  During triage patient was hypertensive and tachycardic  General:          Awake, no distress.  Mouth: Tongue: Presence of white in the surface of the tongue, easily removed with by tongue depressor.  No presence of at  satellite lesions in the tonsil or mucosa. CV:                  Good peripheral perfusion.  Resp:               Normal effort. no tachypnea Abd:                 No distention.  Soft nontender         ED Results / Procedures / Treatments   Labs (all labs ordered are listed, but only abnormal results are displayed) Labs Reviewed  CHLAMYDIA/NGC RT PCR ([facility] ONLY)            WET PREP, GENITAL      PROCEDURES:  Critical Care performed:   Procedures   MEDICATIONS ORDERED IN ED: Medications - No data to display    IMPRESSION / MDM / ASSESSMENT AND PLAN / ED COURSE  I reviewed the triage vital signs and the  nursing notes.  Differential diagnosis includes, but is not limited to, candidiasis, pharyngitis, unlikely hairy  leukoplakia, chlamydia, gonorrhea Patient's presentation is most consistent with acute complicated illness / injury requiring diagnostic workup.   Elizabeth Wheeler is a 23 y.o., female with history of 3 days of lesions on her after having oral sex.  Patient endorses there is a burning sensation in her head and tongue when she eats.  On a physical exam, patient is stable, presence of white plaque in the tongue surface.  Easily removed by tongue depressor.  No satellite lesions in the oral mucosa or tonsils.  Rest of physical exam is normal. Plan Chlamydia, gonorrhea, wet mount. Discharge with nystatin Patient's diagnosis is consistent with oral candidiasis. I did not order any imaging, physical exam is reassuring.  I did order labs to rule out STD as requested by the patient.  Patient wants to be discharged and she will be checked for results on MyChart.  I did review the patient's allergies and medications.The patient is in stable and satisfactory condition for discharge home  Patient will be discharged home with prescriptions for nystatin. Patient is to follow up with [facility] as needed or otherwise directed. Patient is given ED precautions to return to the ED for  any worsening or new symptoms.  Patient does not have PCP, she will be referred to [facility] clinic to establish care Discussed plan of care with patient, answered all of patient's questions, Patient agreeable to plan of care. Advised patient to take medications according to the instructions on the label. Discussed possible side effects of new medications. Patient verbalized understanding.  FINAL CLINICAL IMPRESSION(S) / ED DIAGNOSES   Final diagnoses:  Oral candidiasis     Rx / DC Orders   ED Discharge Orders          Ordered    nystatin (MYCOSTATIN) 100000 UNIT/ML suspension  4 times daily        03/18/24 1850             Note:  This document was prepared using Dragon voice recognition software and may include unintentional dictation errors.   Janit Kast, PA-C 03/18/24 1851    Jacolyn Pae, MD 03/18/24 1954

## 2024-03-18 NOTE — ED Notes (Signed)
 Patient taken back to triage, states she needs to pick up her brothers at 2:45 and needs to leave. States she will come back later.

## 2024-03-18 NOTE — ED Triage Notes (Signed)
 Patient states she has white patches on tongue since this morning.

## 2024-03-20 ENCOUNTER — Emergency Department: Payer: MEDICAID

## 2024-03-20 ENCOUNTER — Emergency Department
Admission: EM | Admit: 2024-03-20 | Discharge: 2024-03-20 | Disposition: A | Discharge status: Home / Self Care | Payer: MEDICAID | Attending: Emergency Medicine | Admitting: Emergency Medicine

## 2024-03-20 ENCOUNTER — Other Ambulatory Visit: Payer: Self-pay

## 2024-03-20 DIAGNOSIS — N939 Abnormal uterine and vaginal bleeding, unspecified: Secondary | ICD-10-CM

## 2024-03-20 DIAGNOSIS — M545 Low back pain, unspecified: Secondary | ICD-10-CM | POA: Diagnosis present

## 2024-03-20 DIAGNOSIS — A749 Chlamydial infection, unspecified: Secondary | ICD-10-CM | POA: Diagnosis not present

## 2024-03-20 DIAGNOSIS — D500 Iron deficiency anemia secondary to blood loss (chronic): Secondary | ICD-10-CM | POA: Insufficient documentation

## 2024-03-20 LAB — CBC WITH DIFFERENTIAL/PLATELET
Abs Immature Granulocytes: 0.04 K/uL (ref 0.00–0.07)
Basophils Absolute: 0.1 K/uL (ref 0.0–0.1)
Basophils Relative: 1 %
Eosinophils Absolute: 0 K/uL (ref 0.0–0.5)
Eosinophils Relative: 0 %
HCT: 27.6 % — ABNORMAL LOW (ref 36.0–46.0)
Hemoglobin: 8.7 g/dL — ABNORMAL LOW (ref 12.0–15.0)
Immature Granulocytes: 0 %
Lymphocytes Relative: 17 %
Lymphs Abs: 1.8 K/uL (ref 0.7–4.0)
MCH: 25.6 pg — ABNORMAL LOW (ref 26.0–34.0)
MCHC: 31.5 g/dL (ref 30.0–36.0)
MCV: 81.2 fL (ref 80.0–100.0)
Monocytes Absolute: 0.9 K/uL (ref 0.1–1.0)
Monocytes Relative: 9 %
Neutro Abs: 7.8 K/uL — ABNORMAL HIGH (ref 1.7–7.7)
Neutrophils Relative %: 73 %
Platelets: 517 K/uL — ABNORMAL HIGH (ref 150–400)
RBC: 3.4 MIL/uL — ABNORMAL LOW (ref 3.87–5.11)
RDW: 14.6 % (ref 11.5–15.5)
WBC: 10.7 K/uL — ABNORMAL HIGH (ref 4.0–10.5)
nRBC: 0 % (ref 0.0–0.2)

## 2024-03-20 LAB — COMPREHENSIVE METABOLIC PANEL WITH GFR
ALT: 8 U/L (ref 0–44)
AST: 22 U/L (ref 15–41)
Albumin: 4.5 g/dL (ref 3.5–5.0)
Alkaline Phosphatase: 70 U/L (ref 38–126)
Anion gap: 12 (ref 5–15)
BUN: 10 mg/dL (ref 6–20)
CO2: 26 mmol/L (ref 22–32)
Calcium: 9.6 mg/dL (ref 8.9–10.3)
Chloride: 101 mmol/L (ref 98–111)
Creatinine, Ser: 0.73 mg/dL (ref 0.44–1.00)
GFR, Estimated: >60 mL/min (ref >60)
Glucose, Bld: 91 mg/dL (ref 70–99)
Potassium: 3.8 mmol/L (ref 3.5–5.1)
Sodium: 139 mmol/L (ref 135–145)
Total Bilirubin: 0.6 mg/dL (ref 0.0–1.2)
Total Protein: 7.8 g/dL (ref 6.5–8.1)

## 2024-03-20 LAB — POC URINE PREG, ED: Preg Test, Ur: NEGATIVE

## 2024-03-20 MED ORDER — DOXYCYCLINE HYCLATE 100 MG PO TABS: 100.0000 mg | ORAL_TABLET | Freq: Two times a day (BID) | ORAL | 0 refills | Status: AC

## 2024-03-20 MED ORDER — CEFTRIAXONE SODIUM 1 G IJ SOLR
500.0000 mg | Freq: Once | INTRAMUSCULAR | Status: AC
  Administered 2024-03-20: 500 mg via INTRAMUSCULAR
  Filled 2024-03-20: qty 10

## 2024-03-20 MED ORDER — DOXYCYCLINE HYCLATE 100 MG PO TABS
100.0000 mg | ORAL_TABLET | Freq: Once | ORAL | Status: AC
  Administered 2024-03-20: 100 mg via ORAL
  Filled 2024-03-20: qty 1

## 2024-03-20 MED ORDER — LIDOCAINE HCL (PF) 1 % IJ SOLN
INTRAMUSCULAR | Status: AC
  Filled 2024-03-20: qty 5

## 2024-03-20 MED ORDER — FERROUS SULFATE 300 (60 FE) MG/5ML PO SOLN: 300.0000 mg | Freq: Every day | ORAL | 3 refills | Status: AC

## 2024-03-20 NOTE — ED Provider Notes (Signed)
 [facility] Provider Note    Event Date/Time   First MD Initiated Contact with Patient 03/20/24 1804     (approximate)   History   Abnormal Result    HPI  Elizabeth Wheeler is a 23 y.o. female    with a past medical history of pelvic pain, bacterial vaginosis, abdominal cramping, who presents to the ED complaining of abnormal. According to the patient, she was seen here on 03/18/2024, patient requested testing for STDs.  Chlamydia came back positive.  Patient is here for treatment.  Patient endorses her last menstrual period started in 03/07/2024, and in the last 2 days vaginal pain increased, with expulsion of clots, associated to cramps and lower back pain.  Patient denies urinary symptoms.  Patient is taking treatment for oral candidiasis with partial resolution of the plaques on her tongue.  Patient is here by herself.    Physical Exam   Triage Vital Signs: ED Triage Vitals  Encounter Vitals Group     BP 03/20/24 1806 120/87     Girls Systolic BP Percentile --      Girls Diastolic BP Percentile --      Boys Systolic BP Percentile --      Boys Diastolic BP Percentile --      Pulse Rate 03/20/24 1806 (!) 111     Resp 03/20/24 1806 20     Temp 03/20/24 1806 98.2 F (36.8 C)     Temp src --      SpO2 03/20/24 1806 100 %     Weight --      Height --      Head Circumference --      Peak Flow --      Pain Score 03/20/24 1807 0     Pain Loc --      Pain Education --      Exclude from Growth Chart --     Most recent vital signs: Vitals:   03/20/24 1806  BP: 120/87  Pulse: (!) 111  Resp: 20  Temp: 98.2 F (36.8 C)  SpO2: 100%     Physical Exam Vitals and nursing note reviewed.  During triage patient was tachycardic  General:          Awake, no distress.  CV:                  Good peripheral perfusion.  Resp:               Normal effort. no tachypnea Abd:                 No distention.  Soft nontender Other:               ED Results /  Procedures / Treatments   Labs (all labs ordered are listed, but only abnormal results are displayed) Labs Reviewed  CBC WITH DIFFERENTIAL/PLATELET - Abnormal; Notable for the following components:      Result Value   WBC 10.7 (*)    RBC 3.40 (*)    Hemoglobin 8.7 (*)    HCT 27.6 (*)    MCH 25.6 (*)    Platelets 517 (*)    Neutro Abs 7.8 (*)    All other components within normal limits  COMPREHENSIVE METABOLIC PANEL WITH GFR  POC URINE PREG, ED       PROCEDURES:  Critical Care performed:   Procedures   MEDICATIONS ORDERED IN ED: Medications  doxycycline  (VIBRA -TABS) tablet 100 mg (  100 mg Oral Given 03/20/24 1831)  cefTRIAXone  (ROCEPHIN ) injection 500 mg (500 mg Intramuscular Given 03/20/24 1835)  lidocaine  (PF) (XYLOCAINE ) 1 % injection (  Given 03/20/24 1857)   Clinical Course as of 03/20/24 1940  Sun Mar 20, 2024  1902 CBC with Differential(!) Leukocytes ptosis of 10.7.  Anemia, hemoglobin 8.7 is lower when compared with 5 months ago 10.5.  Neutrophilia of 7.8. [AE]  1903 POC urine preg, ED Negative [AE]  1903 Comprehensive metabolic panel Electrolytes, renal function, liver function, anion gap within normal limits [AE]  1920 US  Pelvis Complete Endometrial atrophy with a small amount of intracavitary fluid. [AE]    Clinical Course User Index [AE] Janit Kast, PA-C    IMPRESSION / MDM / ASSESSMENT AND PLAN / ED COURSE  I reviewed the triage vital signs and the nursing notes.  Differential diagnosis includes, but is not limited to, Chlamydia trachomatis, gonorrhea, bacterial vaginosis, PID  Patient's presentation is most consistent with acute, uncomplicated illness.   Elizabeth Wheeler is a 23 y.o., female who presents today after checking on MyChart her chlamydia report that came back positive.  Patient is here for treatment.  Patient endorses her last menstrual period started in 03/07/2024 and in the last 2 days she is having abdominal cramps, lower back  pain, and vaginal bleeding that increased, with expulsion of clots.  On a physical exam patient was tachycardic during triage.  Mouth there is partial resolution of her oral candidiasis, right side of the tongue with white plaques.  Tongue left side is clean.  The pulmonary is clear.  Abdomen skin is intact, no scars, no ecchymosis or hematomas.  Bowel sounds positive.  Tenderness to palpation in the right lower quadrant. Plan Pelvic ultrasound CBC and CMP Pregnancy test Ceftriaxone  IM Doxycycline  Reassess Recheck vital signs before discharge Pelvic ultrasound showed endometrial atrophy with small amount of intracavitary fluid.  CBC showed anemia with hemoglobin of 8.7 that is lower than 5 months ago with 10.5.  I did reinterrogate the patient and she is feeling without energy, weak, and has cravings for ice.  CBC showed leukocytosis with white blood cells of 10.7 and neutrophilia of 7.8 that can be due to her chlamydia infection.  Pregnancy test was negative. Independent chart review patient is allergic to amoxicillin, I did interrogate the patient she is not having allergic reaction to amoxicillin she is having side effects of the amoxicillin with vaginal candidiasis. Patient's diagnosis is consistent with chlamydia trichomoniasis, anemia, abnormal vaginal bleeding. I independently reviewed and interpreted imaging and agree with radiologists findings. Labs are  reassuring. I did review the patient's allergies and medications.The patient is in stable and satisfactory condition for discharge home  Patient will be discharged home with prescriptions for doxycycline , ferrous  sulfate.  I did advise patient to take ibuprofen  as needed for cramps.  Patient is to follow up with OB/GYN and PCP as needed or otherwise directed. Patient is given ED precautions to return to the ED for any worsening or new symptoms. Discussed plan of care with patient, answered all of patient's questions, Patient agreeable to plan  of care. Advised patient to take medications according to the instructions on the label. Discussed possible side effects of new medications. Patient verbalized understanding.  FINAL CLINICAL IMPRESSION(S) / ED DIAGNOSES   Final diagnoses:  Chlamydia infection  Iron deficiency anemia due to chronic blood loss  Abnormal vaginal bleeding     Rx / DC Orders   ED Discharge Orders  Ordered    doxycycline  (VIBRA -TABS) 100 MG tablet  2 times daily        03/20/24 1933    ferrous sulfate  300 (60 Fe) MG/5ML syrup  Daily        03/20/24 1933             Note:  This document was prepared using Dragon voice recognition software and may include unintentional dictation errors.   Janit Kast, PA-C 03/20/24 1940    Waymond Lorelle Cummins, MD 03/28/24 [phone]

## 2024-03-20 NOTE — ED Triage Notes (Signed)
 Pt to Ed via POV from home. Pt here due to abnormal wet prep result and came back in for prescription.

## 2024-03-20 NOTE — Discharge Instructions (Addendum)
 You have been diagnosed with chlamydia infection, iron deficiency anemia due to chronic blood loss, abnormal vaginal bleeding.  Please take doxycycline  1 tablet by mouth every 12 hours after main meals for 10 days.  Please take ferrous sulfate 5 mL by mouth daily after lunch.  Please call and make an appointment with OB/GYN.  You can take ibuprofen  after main meals for cramps.  Please come back to ED or go to your PCP if you have new symptoms or symptoms worsen.  It was a pleasure to help you today.  Alexsandra Shontz, PA-C

## 2024-03-21 ENCOUNTER — Telehealth: Payer: Self-pay | Admitting: Emergency Medicine

## 2024-03-21 MED ORDER — FERROUS GLUCONATE 324 (38 FE) MG PO TABS
324.0000 mg | ORAL_TABLET | Freq: Every day | ORAL | 3 refills | Status: AC
Start: 2024-03-21 — End: ?

## 2024-04-13 ENCOUNTER — Emergency Department
Admission: EM | Admit: 2024-04-13 | Discharge: 2024-04-13 | Disposition: A | Discharge status: Home / Self Care | Payer: MEDICAID | Attending: Emergency Medicine | Admitting: Emergency Medicine

## 2024-04-13 ENCOUNTER — Other Ambulatory Visit: Payer: Self-pay

## 2024-04-13 ENCOUNTER — Emergency Department: Payer: MEDICAID

## 2024-04-13 DIAGNOSIS — D509 Iron deficiency anemia, unspecified: Secondary | ICD-10-CM | POA: Insufficient documentation

## 2024-04-13 DIAGNOSIS — R079 Chest pain, unspecified: Secondary | ICD-10-CM

## 2024-04-13 DIAGNOSIS — R112 Nausea with vomiting, unspecified: Secondary | ICD-10-CM | POA: Diagnosis not present

## 2024-04-13 DIAGNOSIS — R0789 Other chest pain: Secondary | ICD-10-CM | POA: Diagnosis present

## 2024-04-13 LAB — CHLAMYDIA/NGC RT PCR (ARMC ONLY)
Chlamydia Tr: NOT DETECTED
N gonorrhoeae: NOT DETECTED

## 2024-04-13 LAB — BASIC METABOLIC PANEL WITH GFR
Anion gap: 16 — ABNORMAL HIGH (ref 5–15)
BUN: 9 mg/dL (ref 6–20)
CO2: 21 mmol/L — ABNORMAL LOW (ref 22–32)
Calcium: 9.5 mg/dL (ref 8.9–10.3)
Chloride: 98 mmol/L (ref 98–111)
Creatinine, Ser: 0.64 mg/dL (ref 0.44–1.00)
GFR, Estimated: >60 mL/min (ref >60)
Glucose, Bld: 91 mg/dL (ref 70–99)
Potassium: 4 mmol/L (ref 3.5–5.1)
Sodium: 135 mmol/L (ref 135–145)

## 2024-04-13 LAB — CBC
HCT: 31.7 % — ABNORMAL LOW (ref 36.0–46.0)
Hemoglobin: 9.9 g/dL — ABNORMAL LOW (ref 12.0–15.0)
MCH: 25.2 pg — ABNORMAL LOW (ref 26.0–34.0)
MCHC: 31.2 g/dL (ref 30.0–36.0)
MCV: 80.7 fL (ref 80.0–100.0)
Platelets: 426 K/uL — ABNORMAL HIGH (ref 150–400)
RBC: 3.93 MIL/uL (ref 3.87–5.11)
RDW: 16.2 % — ABNORMAL HIGH (ref 11.5–15.5)
WBC: 12.7 K/uL — ABNORMAL HIGH (ref 4.0–10.5)
nRBC: 0 % (ref 0.0–0.2)

## 2024-04-13 LAB — HIV ANTIBODY (ROUTINE TESTING W REFLEX): HIV Screen 4th Generation wRfx: NONREACTIVE

## 2024-04-13 LAB — TROPONIN T, HIGH SENSITIVITY: Troponin T High Sensitivity: <15 ng/L (ref 0–19)

## 2024-04-13 LAB — POC URINE PREG, ED: Preg Test, Ur: NEGATIVE

## 2024-04-13 MED ORDER — ONDANSETRON 4 MG PO TBDP: 4.0000 mg | ORAL_TABLET | Freq: Three times a day (TID) | ORAL | 0 refills | Status: DC | PRN

## 2024-04-13 NOTE — ED Provider Notes (Signed)
 [facility] Provider Note   Event Date/Time   First MD Initiated Contact with Patient 04/13/24 1622     (approximate) History  Chest Pain  HPI Elizabeth Wheeler is a 23 y.o. female with a stated past medical history of iron deficiency anemia and recent diagnosis of chlamydia who presents complaining of of chest pain, nausea and vomiting, and persistent vaginal discomfort.  Patient states that she has been having vomiting after taking her doses of doxycycline  and iron.  Patient is concerned that her symptoms are not being treated due to vomiting of her antibiotic.  Patient also states that her period is late and requests a pregnancy test.  Patient states LMP 11/4.  States his chest discomfort started after vomiting and complains of burning, central pain that does not radiate and feels occasionally like pressure.  Patient denies any exertional worsening or associated shortness of breath ROS: Patient currently denies any vision changes, tinnitus, difficulty speaking, facial droop, sore throat, shortness of breath, abdominal pain, nausea/vomiting/diarrhea, dysuria, or weakness/numbness/paresthesias in any extremity   Physical Exam  Triage Vital Signs: ED Triage Vitals [04/13/24 1549]  Encounter Vitals Group     BP (!) 137/95     Girls Systolic BP Percentile      Girls Diastolic BP Percentile      Boys Systolic BP Percentile      Boys Diastolic BP Percentile      Pulse Rate 91     Resp 18     Temp 98.7 F (37.1 C)     Temp src      SpO2 100 %     Weight 115 lb (52.2 kg)     Height 5' 5 (1.651 m)     Head Circumference      Peak Flow      Pain Score 7     Pain Loc      Pain Education      Exclude from Growth Chart    Most recent vital signs: Vitals:   04/13/24 1549  BP: (!) 137/95  Pulse: 91  Resp: 18  Temp: 98.7 F (37.1 C)  SpO2: 100%   General: Awake, oriented x4. CV:  Good peripheral perfusion. Resp:  Normal effort. Abd:  No  distention. Other:  EEL adult well-developed, well-nourished African-American female resting comfortably in no acute distress ED Results / Procedures / Treatments  Labs (all labs ordered are listed, but only abnormal results are displayed) Labs Reviewed  BASIC METABOLIC PANEL WITH GFR - Abnormal; Notable for the following components:      Result Value   CO2 21 (*)    Anion gap 16 (*)    All other components within normal limits  CBC - Abnormal; Notable for the following components:   WBC 12.7 (*)    Hemoglobin 9.9 (*)    HCT 31.7 (*)    MCH 25.2 (*)    RDW 16.2 (*)    Platelets 426 (*)    All other components within normal limits  POC URINE PREG, ED - Normal  CHLAMYDIA/NGC RT PCR ([facility] ONLY)            HIV ANTIBODY (ROUTINE TESTING W REFLEX)  TROPONIN T, HIGH SENSITIVITY   EKG ED ECG REPORT I, Artist MARLA Kerns, the attending physician, personally viewed and interpreted this ECG. Date: 04/13/2024 EKG Time: 1554 Rate: 76 Rhythm: normal sinus rhythm QRS Axis: normal Intervals: normal ST/T Wave abnormalities: normal Narrative Interpretation: no evidence of acute ischemia RADIOLOGY  ED MD interpretation: 2 view chest x-ray interpreted by me shows no evidence of acute abnormalities including no pneumonia, pneumothorax, or widened mediastinum - All radiology independently interpreted and agree with radiology assessment Official radiology report(s): DG Chest 2 View Result Date: 04/13/2024 CLINICAL DATA:  Central chest pain and short of breath for 3 days EXAM: CHEST - 2 VIEW COMPARISON:  10/27/2022 FINDINGS: The heart size and mediastinal contours are within normal limits. Both lungs are clear. The visualized skeletal structures are unremarkable. IMPRESSION: No active cardiopulmonary disease. Electronically Signed   By: Ozell Daring M.D.   On: 04/13/2024 16:36   PROCEDURES: Critical Care performed: No Procedures MEDICATIONS ORDERED IN ED: Medications - No data to  display IMPRESSION / MDM / ASSESSMENT AND PLAN / ED COURSE  I reviewed the triage vital signs and the nursing notes.                             The patient is on the cardiac monitor to evaluate for evidence of arrhythmia and/or significant heart rate changes. Patient's presentation is most consistent with acute presentation with potential threat to life or bodily function. Patient is a 23 year old female with the above-stated past medical history who presents for multiple complaints occluding chest pain, nausea/vomiting, and medication nonadherence secondary to this nausea/vomiting DDx: Gastroenteritis, medication side effect, food poisoning, small bowel obstruction, ACS, PE Plan: CBC, BMP, UA, urine pregnancy, troponin, EKG, chest x-ray, GC chlamydia  Laboratory radiologic evaluations not show any evidence of acute abnormalities.  Patient is no longer positive for gonorrhea or chlamydia and therefore would not need any repeat antibiotic dosings.  Patient is p.o. tolerant in our emergency department however due to iron pills causing significant stomach upset will provide Zofran  to take before taking her iron supplementation.  Also discussed with patient the need to speak to their primary care physician about possibly setting up iron infusions.  Patient agrees with plan for discharge at this time with outpatient follow-up.  Patient was given strict return precautions and all questions answered prior to discharge  Dispo: Discharge home with PCP follow-up   FINAL CLINICAL IMPRESSION(S) / ED DIAGNOSES   Final diagnoses:  Chest pain, unspecified type  Nausea and vomiting, unspecified vomiting type  Iron deficiency anemia, unspecified iron deficiency anemia type   Rx / DC Orders   ED Discharge Orders          Ordered    ondansetron  (ZOFRAN -ODT) 4 MG disintegrating tablet  Every 8 hours PRN        04/13/24 1946           Note:  This document was prepared using Dragon voice recognition  software and may include unintentional dictation errors.   Jossie Artist POUR, MD 04/13/24 [phone]

## 2024-04-13 NOTE — ED Triage Notes (Addendum)
 Pt comes with c/o cp that started three days ago. Pt states she is also anemic and hasn't been able to keep her iron pills down. Pt states she has been vomiting and nauseated. Pt is also 6 days late on period.   Pt also states she needs to be checked for STD and had ran out of meds.

## 2024-04-13 NOTE — ED Notes (Signed)
 AVS provided by edp was reviewed with the pt. Discussed pending lab with pt and accesss to my chart. Pt verbalized understanding with no additional questions at this time. Pt verified pharmacy.

## 2024-05-11 ENCOUNTER — Other Ambulatory Visit: Payer: Self-pay

## 2024-05-11 ENCOUNTER — Encounter: Payer: Self-pay | Admitting: Emergency Medicine

## 2024-05-11 ENCOUNTER — Emergency Department
Admission: EM | Admit: 2024-05-11 | Discharge: 2024-05-11 | Disposition: A | Discharge status: Home / Self Care | Payer: MEDICAID | Attending: Emergency Medicine | Admitting: Emergency Medicine

## 2024-05-11 DIAGNOSIS — R0981 Nasal congestion: Secondary | ICD-10-CM | POA: Insufficient documentation

## 2024-05-11 DIAGNOSIS — R059 Cough, unspecified: Secondary | ICD-10-CM | POA: Insufficient documentation

## 2024-05-11 DIAGNOSIS — R509 Fever, unspecified: Secondary | ICD-10-CM | POA: Insufficient documentation

## 2024-05-11 DIAGNOSIS — J111 Influenza due to unidentified influenza virus with other respiratory manifestations: Secondary | ICD-10-CM

## 2024-05-11 DIAGNOSIS — R519 Headache, unspecified: Secondary | ICD-10-CM | POA: Insufficient documentation

## 2024-05-11 MED ORDER — PSEUDOEPHEDRINE HCL 30 MG PO TABS: 30.0000 mg | ORAL_TABLET | Freq: Four times a day (QID) | ORAL | 2 refills | Status: DC | PRN

## 2024-05-11 MED ORDER — ACETAMINOPHEN 325 MG PO TABS
650.0000 mg | ORAL_TABLET | Freq: Once | ORAL | Status: AC
  Administered 2024-05-11: 650 mg via ORAL
  Filled 2024-05-11: qty 2

## 2024-05-11 MED ORDER — KETOROLAC TROMETHAMINE 15 MG/ML IJ SOLN
15.0000 mg | Freq: Once | INTRAMUSCULAR | Status: AC
  Administered 2024-05-11: 15 mg via INTRAMUSCULAR
  Filled 2024-05-11: qty 1

## 2024-05-11 MED ORDER — ONDANSETRON 4 MG PO TBDP: 4.0000 mg | ORAL_TABLET | Freq: Three times a day (TID) | ORAL | 0 refills | Status: AC | PRN

## 2024-05-11 MED ORDER — ONDANSETRON 4 MG PO TBDP: 4.0000 mg | ORAL_TABLET | Freq: Three times a day (TID) | ORAL | 0 refills | Status: DC | PRN

## 2024-05-11 MED ORDER — PSEUDOEPHEDRINE HCL 30 MG PO TABS: 30.0000 mg | ORAL_TABLET | Freq: Four times a day (QID) | ORAL | 2 refills | Status: AC | PRN

## 2024-05-11 NOTE — ED Triage Notes (Signed)
 Pt via POV from home. Pt c/o fever, cough, nasal congestion, and headache for the 3-4 days, worse last night. 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 Pt is A&Ox4 and NAD, ambulatory to triage.

## 2024-05-11 NOTE — Discharge Instructions (Signed)
 You were seen in the emergency department today for a cough, headache, and congestion . Your presentation is most consistent with a viral upper respiratory infection such as influenza.    A viral cough may last up to 2-3 weeks.   Stay hydrated by drinking plenty of fluids to thin mucus. Get adequate amount of sleep and avoid overexertion. Consider a humidifier at night. Warm teas and a spoonful of honey may help reduce cough frequency. Follow up with your primary care provider as needed.   SORE THROAT  Use throat lozenges or Chloraseptic spray.  Gargle with warm salt water several times daily  EAR PAIN  Use a saline nasal spray or decongestant to reduce nasal swelling and pressure. Keep the ear dry and avoid swimming or submerging the ear in water. Apply a warm compress to the affected ear for 15-20 minutes. Avoid inserting anything into the ear, including cotton swabs.   Pain control:  Ibuprofen  (motrin /aleve /advil ) - You can take 3 tablets (600 mg) every 6 hours as needed for pain/fever.  Acetaminophen  (tylenol ) - You can take 2 extra strength tablets (1000 mg) every 6 hours as needed for pain/fever.  You can alternate these medications or take them together.  Make sure you eat food/drink water when taking these medications.

## 2024-05-11 NOTE — ED Provider Notes (Signed)
 "   [facility] Emergency Department Provider Note     Event Date/Time   First MD Initiated Contact with Patient 05/11/24 1004     (approximate)   History   Fever   HPI  Elizabeth Wheeler is a 24 y.o. female presents to the ED for evaluation of cough, congestion and headache, ear pain x 4 days.  Patient reports her symptoms improved with NyQuil and Mucinex but overnight she reports her headache returned worse. She states pain is similar to a migraine. Endorse light sensitivity. No vision changes. Associated symptoms includes a nonproductive cough.      Physical Exam   Triage Vital Signs: ED Triage Vitals  Encounter Vitals Group     BP 05/11/24 0952 111/86     Girls Systolic BP Percentile --      Girls Diastolic BP Percentile --      Boys Systolic BP Percentile --      Boys Diastolic BP Percentile --      Pulse Rate 05/11/24 0952 (!) 101     Resp 05/11/24 0952 18     Temp 05/11/24 0952 98.4 F (36.9 C)     Temp Source 05/11/24 0952 Oral     SpO2 05/11/24 0952 98 %     Weight 05/11/24 0951 120 lb (54.4 kg)     Height 05/11/24 0951 5' 4 (1.626 m)     Head Circumference --      Peak Flow --      Pain Score 05/11/24 0950 8     Pain Loc --      Pain Education --      Exclude from Growth Chart --     Most recent vital signs: Vitals:   05/11/24 0952  BP: 111/86  Pulse: (!) 101  Resp: 18  Temp: 98.4 F (36.9 C)  SpO2: 98%    General: Well appearing and comfortable. Alert and oriented. INAD.   Head:  NCAT.  Eyes:  PERRLA. EOMI.  Ears:  EACs patent.  Serous fluid behind right TM otherwise normal.  Nose:   Mucosa is moist. No rhinorrhea. Throat: Oropharynx clear. No erythema or exudates. Tonsils not enlarged. Uvula is midline. CV:  Good peripheral perfusion. RRR.  RESP:  Normal effort. LCTAB. No retractions.  ABD:  No distention. Soft, Non tender.  NEURO: Cranial nerves intact. No focal deficits. Speech clear.    ED Results / Procedures  / Treatments   Labs (all labs ordered are listed, but only abnormal results are displayed) Labs Reviewed - No data to display  No results found.  PROCEDURES:  Critical Care performed: No  Procedures   MEDICATIONS ORDERED IN ED: Medications  ketorolac  (TORADOL ) 15 MG/ML injection 15 mg (has no administration in time range)  acetaminophen  (TYLENOL ) tablet 650 mg (has no administration in time range)     IMPRESSION / MDM / ASSESSMENT AND PLAN / ED COURSE  I reviewed the triage vital signs and the nursing notes.                               25 y.o. female presents to the emergency department for evaluation and treatment of URI symptoms. See HPI for further details.   Differential diagnosis includes, but is not limited to viral URI, influenza, COVID, viral gastroenteritis  Patient's presentation is most consistent with acute, uncomplicated illness.  Patient presents with URI symptoms.  She is hemodynamically stable and  on initial assessment she is well-appearing.  Physical exam findings are overall benign stated above.  Normal lung exam.  Normal neuroexam.  There are no red flag signs indicating further workup.  I do suspect presentation is most consistent with a viral URI including influenza.  Education on symptomatic care treatment at home was provided.  ED return precaution discussed.  Patient stable condition for discharge home.  Amatory referral to primary care obtained.  FINAL CLINICAL IMPRESSION(S) / ED DIAGNOSES   Final diagnoses:  Influenza-like illness     Rx / DC Orders   ED Discharge Orders          Ordered    pseudoephedrine  (SUDAFED) 30 MG tablet  Every 6 hours PRN        05/11/24 1049    ondansetron  (ZOFRAN -ODT) 4 MG disintegrating tablet  Every 8 hours PRN        05/11/24 1049    Ambulatory Referral to Primary Care (Establish Care)        05/11/24 1050             Note:  This document was prepared using Dragon voice recognition software and may  include unintentional dictation errors.    Margrette, Jylian Pappalardo A, PA-C 05/11/24 1101    Dorothyann Drivers, MD 05/11/24 1307  "

## 2024-05-16 NOTE — Progress Notes (Signed)
 Vaginitis Molecular Panel Order: [id number]  Status: Final result   Test Result Released: Yes (seen)   Specimen Information: Patient-collected Vaginal Swab  0 Result Notes     View Follow-Up Encounter <redacted file path>    Component Ref Range & Units (hover)   Bacterial Vaginitis Positive Abnormal      Patient positive for BV. Patient was ordered metronidazole <redacted file path> 500 mg Oral 2 times a day (standard) prior to dispo. EMAP messaged to review.

## 2024-05-17 NOTE — Progress Notes (Signed)
 Dr. Donnice Justice messaged asking to change patient's prescription to an updated pharmacy.  Per Dr. Fairy Pump, no further action needed.

## 2024-05-24 ENCOUNTER — Other Ambulatory Visit: Payer: Self-pay

## 2024-05-24 ENCOUNTER — Encounter: Payer: Self-pay | Admitting: Emergency Medicine

## 2024-05-24 ENCOUNTER — Emergency Department
Admission: EM | Admit: 2024-05-24 | Discharge: 2024-05-24 | Disposition: A | Discharge status: Home / Self Care | Payer: MEDICAID | Attending: Emergency Medicine | Admitting: Emergency Medicine

## 2024-05-24 DIAGNOSIS — R519 Headache, unspecified: Secondary | ICD-10-CM

## 2024-05-24 DIAGNOSIS — D649 Anemia, unspecified: Secondary | ICD-10-CM | POA: Insufficient documentation

## 2024-05-24 DIAGNOSIS — N926 Irregular menstruation, unspecified: Secondary | ICD-10-CM | POA: Diagnosis not present

## 2024-05-24 LAB — CBC
HCT: 34.4 % — ABNORMAL LOW (ref 36.0–46.0)
Hemoglobin: 10.7 g/dL — ABNORMAL LOW (ref 12.0–15.0)
MCH: 25.4 pg — ABNORMAL LOW (ref 26.0–34.0)
MCHC: 31.1 g/dL (ref 30.0–36.0)
MCV: 81.5 fL (ref 80.0–100.0)
Platelets: 313 K/uL (ref 150–400)
RBC: 4.22 MIL/uL (ref 3.87–5.11)
RDW: 19.6 % — ABNORMAL HIGH (ref 11.5–15.5)
WBC: 5.4 K/uL (ref 4.0–10.5)
nRBC: 0 % (ref 0.0–0.2)

## 2024-05-24 LAB — COMPREHENSIVE METABOLIC PANEL WITH GFR
ALT: 11 U/L (ref 0–44)
AST: 33 U/L (ref 15–41)
Albumin: 4.6 g/dL (ref 3.5–5.0)
Alkaline Phosphatase: 62 U/L (ref 38–126)
Anion gap: 14 (ref 5–15)
BUN: 6 mg/dL (ref 6–20)
CO2: 21 mmol/L — ABNORMAL LOW (ref 22–32)
Calcium: 9.6 mg/dL (ref 8.9–10.3)
Chloride: 106 mmol/L (ref 98–111)
Creatinine, Ser: 0.61 mg/dL (ref 0.44–1.00)
GFR, Estimated: >60 mL/min
Glucose, Bld: 99 mg/dL (ref 70–99)
Potassium: 4 mmol/L (ref 3.5–5.1)
Sodium: 141 mmol/L (ref 135–145)
Total Bilirubin: 0.3 mg/dL (ref 0.0–1.2)
Total Protein: 8.1 g/dL (ref 6.5–8.1)

## 2024-05-24 LAB — URINALYSIS, ROUTINE W REFLEX MICROSCOPIC
Bilirubin Urine: NEGATIVE
Glucose, UA: NEGATIVE mg/dL
Hgb urine dipstick: NEGATIVE
Ketones, ur: NEGATIVE mg/dL
Leukocytes,Ua: NEGATIVE
Nitrite: NEGATIVE
Protein, ur: NEGATIVE mg/dL
Specific Gravity, Urine: 1.015 (ref 1.005–1.030)
pH: 6 (ref 5.0–8.0)

## 2024-05-24 LAB — POC URINE PREG, ED: Preg Test, Ur: NEGATIVE

## 2024-05-24 MED ORDER — BUTALBITAL-APAP-CAFFEINE 50-325-40 MG PO TABS
1.0000 | ORAL_TABLET | Freq: Once | ORAL | Status: AC
  Administered 2024-05-24: 1 via ORAL
  Filled 2024-05-24: qty 1

## 2024-05-24 NOTE — ED Notes (Signed)
 See triage note  Presents with weakness   Just not feeling well  Pos.nausea Denies any fever,chills vomiting or diarrhea

## 2024-05-24 NOTE — ED Provider Notes (Signed)
 "  [facility] Provider Note    Event Date/Time   First MD Initiated Contact with Patient 05/24/24 0720     (approximate)   History   Period is late   HPI  Marianny Lomas is a 24 y.o. female who presented to the emergency department today with primary concern for being late on her period.  She states she is about 10 days late.  She is concerned that this might be due to her being iron deficient.  She states she is on iron pills.  Per chart review was evaluated in outside ED roughly 1 week ago after a near syncopal episode.  Was found to be anemic at that time. In addition she states she has had a headache for the past three days which has not been helped by tylenol  or ibuprofen .      Physical Exam   Triage Vital Signs: ED Triage Vitals  Encounter Vitals Group     BP 05/24/24 0435 (!) 136/98     Girls Systolic BP Percentile --      Girls Diastolic BP Percentile --      Boys Systolic BP Percentile --      Boys Diastolic BP Percentile --      Pulse Rate 05/24/24 0435 84     Resp 05/24/24 0435 16     Temp 05/24/24 0435 98 F (36.7 C)     Temp Source 05/24/24 0435 Oral     SpO2 05/24/24 0435 100 %     Weight 05/24/24 0424 120 lb (54.4 kg)     Height 05/24/24 0424 5' 4 (1.626 m)     Head Circumference --      Peak Flow --      Pain Score 05/24/24 0424 6     Pain Loc --      Pain Education --      Exclude from Growth Chart --     Most recent vital signs: Vitals:   05/24/24 0435  BP: (!) 136/98  Pulse: 84  Resp: 16  Temp: 98 F (36.7 C)  SpO2: 100%   General: Awake, alert, oriented. CV:  Good peripheral perfusion. Regular rate and rhythm. Resp:  Normal effort. Lungs clear. Abd:  No distention. Non tender.   ED Results / Procedures / Treatments   Labs (all labs ordered are listed, but only abnormal results are displayed) Labs Reviewed  CBC - Abnormal; Notable for the following components:      Result Value   Hemoglobin 10.7 (*)     HCT 34.4 (*)    MCH 25.4 (*)    RDW 19.6 (*)    All other components within normal limits  COMPREHENSIVE METABOLIC PANEL WITH GFR - Abnormal; Notable for the following components:   CO2 21 (*)    All other components within normal limits  URINALYSIS, ROUTINE W REFLEX MICROSCOPIC - Abnormal; Notable for the following components:   Color, Urine YELLOW (*)    APPearance CLEAR (*)    All other components within normal limits  POC URINE PREG, ED     EKG  None   RADIOLOGY None  PROCEDURES:  Critical Care performed: No    MEDICATIONS ORDERED IN ED: Medications - No data to display   IMPRESSION / MDM / ASSESSMENT AND PLAN / ED COURSE  I reviewed the triage vital signs and the nursing notes.  Differential diagnosis includes, but is not limited to, pregnancy, abnormal menstrual cycle, anemia, electrolyte abnormality  Patient's presentation is most consistent with acute presentation with potential threat to life or bodily function.   Patient presented to the emergency department today because of concern for late period. Pregnancy test negative. Patient is anemic however it appears to be her baseline when looking at previous test results. Headache did improve with medication. Will plan on discharging with ob/gyn follow up.     FINAL CLINICAL IMPRESSION(S) / ED DIAGNOSES   Final diagnoses:  Late period  Bad headache  Anemia, unspecified type     Note:  This document was prepared using Dragon voice recognition software and may include unintentional dictation errors.    Floy Roberts, MD 05/24/24 [phone]  "

## 2024-05-24 NOTE — ED Triage Notes (Signed)
 Patient ambulatory to triage with steady gait, without difficulty or distress noted; pt reports that her period is 10 days late and is concerned that her iron level is low; c/o frontal HA; tylenol  taken at midnight without relief

## 2024-06-06 ENCOUNTER — Emergency Department
Admission: EM | Admit: 2024-06-06 | Discharge: 2024-06-06 | Disposition: A | Discharge status: Home / Self Care | Payer: MEDICAID | Attending: Emergency Medicine | Admitting: Emergency Medicine

## 2024-06-06 ENCOUNTER — Other Ambulatory Visit: Payer: Self-pay

## 2024-06-06 DIAGNOSIS — N760 Acute vaginitis: Secondary | ICD-10-CM | POA: Insufficient documentation

## 2024-06-06 DIAGNOSIS — B9689 Other specified bacterial agents as the cause of diseases classified elsewhere: Secondary | ICD-10-CM

## 2024-06-06 DIAGNOSIS — R300 Dysuria: Secondary | ICD-10-CM

## 2024-06-06 LAB — POC URINE PREG, ED: Preg Test, Ur: NEGATIVE

## 2024-06-06 LAB — URINALYSIS, W/ REFLEX TO CULTURE (INFECTION SUSPECTED)
Bilirubin Urine: NEGATIVE
Glucose, UA: NEGATIVE mg/dL
Ketones, ur: NEGATIVE mg/dL
Nitrite: NEGATIVE
Protein, ur: NEGATIVE mg/dL
Specific Gravity, Urine: 1.019 (ref 1.005–1.030)
pH: 5 (ref 5.0–8.0)

## 2024-06-06 LAB — CHLAMYDIA/NGC RT PCR (ARMC ONLY)
Chlamydia Tr: NOT DETECTED
N gonorrhoeae: NOT DETECTED

## 2024-06-06 LAB — WET PREP, GENITAL
Sperm: NONE SEEN
Trich, Wet Prep: NONE SEEN
WBC, Wet Prep HPF POC: <10
Yeast Wet Prep HPF POC: NONE SEEN

## 2024-06-06 MED ORDER — METRONIDAZOLE 0.75 % VA GEL: 1.0000 | Freq: Two times a day (BID) | VAGINAL | 0 refills | Status: AC

## 2024-06-06 MED ORDER — CEPHALEXIN 500 MG PO CAPS: 500.0000 mg | ORAL_CAPSULE | Freq: Four times a day (QID) | ORAL | 0 refills | Status: AC

## 2024-06-07 LAB — SYPHILIS: RPR W/REFLEX TO RPR TITER AND TREPONEMAL ANTIBODIES, TRADITIONAL SCREENING AND DIAGNOSIS ALGORITHM: RPR Ser Ql: NONREACTIVE

## 2024-06-08 LAB — HIV ANTIBODY (ROUTINE TESTING W REFLEX): HIV Screen 4th Generation wRfx: NONREACTIVE
